# Patient Record
Sex: Female | Born: 1935 | Race: White | Hispanic: No | Marital: Married | Smoking: Never smoker
Health system: Southern US, Community
[De-identification: ages and names within clinical notes are randomized; demographics above are authoritative.]

## PROBLEM LIST (undated history)

## (undated) DIAGNOSIS — I2510 Atherosclerotic heart disease of native coronary artery without angina pectoris: Secondary | ICD-10-CM

## (undated) DIAGNOSIS — I482 Chronic atrial fibrillation, unspecified: Secondary | ICD-10-CM

## (undated) DIAGNOSIS — I10 Essential (primary) hypertension: Secondary | ICD-10-CM

## (undated) DIAGNOSIS — J189 Pneumonia, unspecified organism: Secondary | ICD-10-CM

## (undated) HISTORY — PX: EYE SURGERY: SHX253

## (undated) HISTORY — PX: CATARACT EXTRACTION: SUR2

## (undated) HISTORY — PX: COLON SURGERY: SHX602

## (undated) HISTORY — PX: DILATION AND CURETTAGE OF UTERUS: SHX78

## (undated) HISTORY — DX: Pneumonia, unspecified organism: J18.9

## (undated) HISTORY — PX: OTHER SURGICAL HISTORY: SHX169

## (undated) HISTORY — PX: ABDOMINAL HYSTERECTOMY: SHX81

## (undated) HISTORY — DX: Essential (primary) hypertension: I10

## (undated) HISTORY — DX: Chronic atrial fibrillation, unspecified: I48.20

## (undated) HISTORY — DX: Atherosclerotic heart disease of native coronary artery without angina pectoris: I25.10

---

## 1985-05-03 HISTORY — PX: OTHER SURGICAL HISTORY: SHX169

## 1998-02-27 ENCOUNTER — Encounter: Payer: Self-pay | Admitting: Ophthalmology

## 1998-03-04 ENCOUNTER — Ambulatory Visit (HOSPITAL_COMMUNITY): Admission: RE | Admit: 1998-03-04 | Discharge: 1998-03-04 | Payer: Self-pay | Admitting: Ophthalmology

## 2000-11-25 ENCOUNTER — Ambulatory Visit (HOSPITAL_COMMUNITY): Admission: RE | Admit: 2000-11-25 | Discharge: 2000-11-26 | Payer: Self-pay | Admitting: Cardiology

## 2001-01-30 ENCOUNTER — Ambulatory Visit (HOSPITAL_COMMUNITY): Admission: RE | Admit: 2001-01-30 | Discharge: 2001-01-30 | Payer: Self-pay | Admitting: Cardiology

## 2003-04-15 ENCOUNTER — Encounter: Admission: RE | Admit: 2003-04-15 | Discharge: 2003-04-15 | Payer: Self-pay | Admitting: Cardiology

## 2005-11-19 ENCOUNTER — Encounter: Admission: RE | Admit: 2005-11-19 | Discharge: 2005-11-19 | Payer: Self-pay | Admitting: Internal Medicine

## 2005-11-19 ENCOUNTER — Inpatient Hospital Stay (HOSPITAL_COMMUNITY): Admission: AD | Admit: 2005-11-19 | Discharge: 2005-12-15 | Payer: Self-pay | Admitting: Internal Medicine

## 2005-12-06 ENCOUNTER — Encounter: Payer: Self-pay | Admitting: Vascular Surgery

## 2006-06-01 ENCOUNTER — Encounter: Payer: Self-pay | Admitting: Cardiology

## 2007-12-29 ENCOUNTER — Encounter: Admission: RE | Admit: 2007-12-29 | Discharge: 2007-12-29 | Payer: Self-pay | Admitting: Internal Medicine

## 2010-02-02 ENCOUNTER — Ambulatory Visit: Payer: Self-pay | Admitting: Cardiology

## 2010-04-13 ENCOUNTER — Ambulatory Visit: Payer: Self-pay | Admitting: Cardiology

## 2010-08-13 ENCOUNTER — Ambulatory Visit (INDEPENDENT_AMBULATORY_CARE_PROVIDER_SITE_OTHER): Payer: Self-pay | Admitting: Cardiology

## 2010-08-13 ENCOUNTER — Telehealth: Payer: Self-pay | Admitting: Cardiology

## 2010-08-13 DIAGNOSIS — I4891 Unspecified atrial fibrillation: Secondary | ICD-10-CM

## 2010-08-13 NOTE — Telephone Encounter (Signed)
Patient wants to speak with Norma Fredrickson regarding [employer].being a part of [facility]. Please call her back.

## 2010-08-14 NOTE — Telephone Encounter (Signed)
Michelle Mccullough, Can you call her and see if she is to be seen any time soon. I have talked with her about Dr. Ronnald Nian retirement. She would like for you to call after 2 pm today.

## 2010-08-17 ENCOUNTER — Telehealth: Payer: Self-pay | Admitting: Cardiology

## 2010-08-17 NOTE — Telephone Encounter (Signed)
Missed your call on Fri. Regarding who her new Dr.will be after Tennant leaves.

## 2010-08-17 NOTE — Telephone Encounter (Signed)
Returning pts call; LM to call back

## 2010-08-18 NOTE — Telephone Encounter (Signed)
RN scheduled pt to f/u with Dr. [place]az[place] in August.  Pt aware.

## 2010-09-10 ENCOUNTER — Encounter: Payer: Self-pay | Admitting: Cardiology

## 2010-09-11 ENCOUNTER — Ambulatory Visit (INDEPENDENT_AMBULATORY_CARE_PROVIDER_SITE_OTHER): Payer: Medicare Other | Admitting: Cardiology

## 2010-09-11 DIAGNOSIS — I4891 Unspecified atrial fibrillation: Secondary | ICD-10-CM

## 2010-09-18 NOTE — H&P (Signed)
[place]. [facility]  Patient:    Michelle Mccullough, Michelle Mccullough                     MRN: [id number] Adm. Date:  11/25/00 Attending:  Colleen Can. Deborah Chalk, M.D. Dictator:   Jennet Maduro. Earl Gala, R.N., A.N.P. CCDaleen Bo R. Felipa Eth, M.D.   History and Physical  DATE OF BIRTH:  18-Jun-1918  CHIEF COMPLAINT:  Atypical chest pain.  HISTORY OF PRESENT ILLNESS:  Michelle Mccullough is a very pleasant 77 year old white female who appears much younger than her stated age.  She reports to our office on November 23, 2000 upon referral by her primary care Rael Tilly.  She was seen in the office by Dr. Lennon Alstrom. Avva on July 23rd and reported an approximate two-week history of atypical chest pain.  She notes that the discomfort is really more between the breasts, but down into the upper abdomen; perhaps there has been some localized to the left breast.  She describes the discomfort as feeling as "like heartburn."  She is really not prone to have any heartburn and has not had any since her pregnancies over 50 years ago.  She has had no associated shortness of breath.  There has been no light-headedness, no dizziness, no nausea, no vomiting.  She exercises on a routine basis and has had no discomfort noted.  She notes that on occasion she has taken some Tums which has helped intermittently.  She is unsure as to the exact duration of the episodes.  She was started on Nexium 40 mg a day on November 22, 2000.  She does have a feeling as if needing to burp.  When she was seen and evaluated by Dr. Felipa Eth, she was noted to have a slightly enlarged heart on chest x-ray when compared to a previous film.  Her 12-lead electrocardiogram showed new onset of atrial fibrillation when compared to past tracings from 1998.  She is now referred on for elective coronary angiography to rule out coronary disease prior to being initiated on Coumadin therapy.  PAST MEDICAL HISTORY: 1. Hypertension for at least 10 years. 2.  History of pneumonia in 1950. 3. Bilateral cataract surgeries, in 1999 and again in 2001. 4. Abdominal hysterectomy in 1960. 5. History of colon surgery due to a ruptured diverticulum in 1987. 6. Prior history of D&C in 1955. 7. Hemorrhoidectomy in 1962. 8. Childbirth x 2.  There are no reports of previous heart disease, stroke or diabetes or thyroid dysfunction.  ALLERGIES:  SULFA.  INTOLERANCES:  VALIUM causes her "to feel dopey."  CURRENT MEDICINES: 1. Cartia XT 180 mg a day. 2. Premarin daily. 3. HCTZ 25 mg on Mondays, Wednesdays and Fridays. 4. Baby aspirin daily. 5. Multivitamin daily. 6. Nexium 40 mg daily.  FAMILY HISTORY:  Her father lived to be the age of 81 and died of stroke and had a questionable history of hypertension.  Mother lived to the age of 36 and died following hip surgery.  She has two brothers and three sisters who are alive.  One brother has died due to leukemia.  SOCIAL HISTORY:  She is married.  She lives with her husband.  She moved to [facility] on January 12, 2000.  She remains very active.  She has no history of tobacco use and engages in occasional wine use, perhaps one to two times a month.  REVIEW OF SYSTEMS:  She has had no light-headedness,  no dizziness.  There has been no frank syncope.  Her appetite remains good.  Sleep habits are somewhat erratic but that has been of a chronic nature.  She has had no fever, flu or cough.  She has had no shortness of breath.  Her chest discomfort is as described above.  She has had no chest wall tenderness.  She denies a history of palpitations.  No tachycardia.  No heart pounding or forcefulness noted. She has had no abdominal pain, no constipation, no diarrhea, no peripheral edema.  PHYSICAL EXAMINATION:  GENERAL:  She is a very pleasant, quite conversive white female who appears younger than her stated age and she is currently pain-free at rest.  Weight is 125-1/2 pounds.  VITAL SIGNS:   Blood pressure is 130/60, sitting; 120/60, standing.  Heart rate is 88 and irregular.  SKIN:  Warm and dry.  Color is unremarkable.  NECK:  Supple.  No JVD.  LUNGS:  Clear.  HEART:  Irregularly irregular rhythm.  ABDOMEN:  Soft.  Positive bowel sounds.  EXTREMITIES:  Without edema.  Distal pulses are intact.  NEUROLOGIC:  Intact.  LABORATORY DATA:  Labs are currently pending.  CLINICAL DATA:  Twelve-lead electrocardiogram performed on July 23rd shows atrial fibrillation with inverted T waves in leads V2.  OVERALL IMPRESSION: 1. Atypical chest pain. 2. New onset of atrial fibrillation of questionable duration. 3. Reported cardiomegaly. 4. Hypertension.  PLAN:  We will obtain 2-D echocardiogram here in the office today on July 24th.  Prescription for sublingual nitroglycerin is made available to her with full instructions given.  We will need to proceed on with cardiac catheterization; that procedure is discussed in full detail including the risks of possibility of bleeding, bruising, blood clot to the leg, as well as possibility of allergy to the x-ray dye, irregular rhythms, stroke, heart attack and even the possibility of death was discussed with both her and her husband and they are willing to proceed.  The procedure has been tentatively arranged for Friday, November 25, 2000. DD:  11/23/00 TD:  11/23/00 Job: [id number]

## 2010-09-18 NOTE — H&P (Signed)
Michelle Mccullough, Michelle Mccullough            ACCOUNT NO.:  [id number]   MEDICAL RECORD NO.:  [id number]          PATIENT TYPE:  INP   LOCATION:  3710                         FACILITY:  [facility]   PHYSICIAN:  Larina Earthly, M.D.        DATE OF BIRTH:  July 17, 1918   DATE OF ADMISSION:  11/19/2005  DATE OF DISCHARGE:                                HISTORY & PHYSICAL   CHIEF COMPLAINT:  Abdominal pain and nausea.   HISTORY OF PRESENT ILLNESS:  This is an 77 year old Caucasian female with  multiple remote abdominal surgeries in the setting of atrial fibrillation,  on anticoagulation, managed by Dr. Deborah Chalk, who presented to my office on  November 18, 2005 with an 18-hour history of increasing abdominal pain that  started approximately 1-1/2 hours after her last bowel movement.  Her  symptoms were associated with nausea but no vomiting.  No fevers or chills.  No shortness of breath or chest pain.  She had no blood in her stools or in  her urine.  Patient was seen towards the end of the day on November 18, 2005,  given the fact that her physical exam was unremarkable with the exception of  some mild-to-moderate centrally located abdominal discomfort without  rebound.  The plan was to obtain labs in our office as well as an outpatient  abdominal pelvic CT scan the next morning with IV and p.o. contrast.  She  was also started empirically on Augmentin 875 mg p.o. b.i.d. with food.  She  and her husband felt satisfied with this approach versus ER evaluation.   She presents back to my office at lunch time on November 19, 2005 with a copy of  her CT scan report in hand revealing a partial small bowel obstruction,  likely due to adhesion in the distal small bowel in the left lower quadrant  associated with a small amount of ascites but no free air or other  complications.  She states that she has not been able to eat anything since  last seen other than the p.o. contrast.  She states that she has not had any  episodes of  vomiting but continues to have the abdominal discomfort.  She  denies any fevers or chills and is able to walk unimpeded around our office.   Given the findings on her CT scan and review of the labs obtained the  previous night, which includes a white blood cell count of 12.8, she is now  being admitted to a telemetry bed over at [facility] for possible  surgical evaluation and consult by Dr. Wenda Low as well as empiric  antibiotics and continued observation.   REVIEW OF SYSTEMS:  As above, but specifically negative for fevers, chills,  vomiting, blood in stool or urine.  Negative for rigors, negative for chest  pain, shortness of breath, or palpitations.  Negative for new neurologic or  musculoskeletal deficits.   PROBLEM LIST:  1. Hypertension.  2. Atrial fibrillation with a history of DC cardioversion but continues to      be in atrial fibrillation.  3. Anticoagulation.  4.  Total abdominal hysterectomy in 1967 with the exception of one ovary.  5. Lysis of adhesions as well as unilateral salpingo-oophorectomy in 1987.  6. Degenerative joint disease.  7. Hemorrhoidectomy in the 1970s.  8. Hyperlipidemia.  9. Osteopenia.   SOCIAL HISTORY:  The patient is married for 65 years, is a retired  Airline pilot and has no history of alcohol or tobacco use.   FAMILY HISTORY:  Significant for a father having died at the age of 65 of  congestive heart failure and stroke.  Mother having died at the age of 67 of  degenerative joint disease and heart failure.  Multiple siblings have  arthritis.   ALLERGIES:  SULFA, VALIUM.   MEDICATIONS:  1. Patient is currently on Diltiazem extended release, 240 mg each day.  2. Coumadin 5 mg 4 times a week and 2.5 mg 3 times a week, managed by Dr.      Deborah Chalk.  3. Multivitamin.  4. Tylenol p.r.n.  5. Calcium.  6. Vitamin D supplementation.  7. Patient has a history of using Zocor 40 mg; however, this has been      discontinued by the  patient.   LABORATORY EVALUATION:  CT scan as above.   Sodium 140, potassium 4.2, BUN 19, creatinine 1.3, glucose 182, calcium  10.3, AST 24, ALT 24, total bilirubin 1.3, alkaline phosphatase 81.  White  blood cell count 12.8, hemoglobin 16.2, hematocrit 48%, platelet count 268.  Urinalysis positive for slight infection.  Please note that these labs were  obtained in our office from November 18, 2005.  Further labs are pending at the  hospital.   PHYSICAL EXAMINATION:  VITAL SIGNS:  Weight is 115 pounds.  Blood pressure  110/74, pulse 80, respirations 18 with blood pressure increasing to 130/96  with some agitation.  Temperature 98.3 degrees Fahrenheit.  GENERAL:  We have an anxious but stable Caucasian female sitting on an exam  table in no apparent distress, answering all questions appropriately.  Alert  and oriented x3.  HEENT:  Sclerae are anicteric.  Extraocular movements are intact.  There are  no oropharyngeal lesions.  NECK:  Supple.  There is no cervical lymphadenopathy.  LUNGS:  Clear to auscultation bilaterally.  CARDIOVASCULAR:  An irregularly irregular rhythm.  ABDOMEN:  Soft, nondistended abdomen.  Bowel sounds are present.  Patient is  tender in the left greater than right lower quadrant to a moderate degree  with no rebound.  EXTREMITIES:  There is no lower extremity edema.  Pedal pulses are intact.  NEUROLOGIC:  Grossly nonfocal.   ASSESSMENT/PLAN:  1. Partial small bowel obstruction:  Will give clear fluids and observe.      Will obtain surgical consultation with Dr. Wenda Low and provide      empiric IV antibiotics consisting of Unasyn after blood cultures are      obtained, especially in light of elevated white blood cell count.  Of      note, there is no evidence of an infection that was seen on the CT scan      by preliminary report.  2. Atrial fibrillation:  Now currently hemodynamically stable on calcium     channel blocker and anticoagulation.  May need to  change calcium      channel blockade to the intravenous form, especially if the patient is      made n.p.o. or becomes unstable.  3. Anticoagulation:  Will hold anticoagulation for now and will defer      reversal of  the Coumadin levels to Dr.      Daphine Deutscher, pending potential surgery.  4. Hypertension:  Controlled.  5. Per my previous and current discussions with the patient, the patient      wishes a DNR code status.      Larina Earthly, M.D.  Electronically Signed     RA/MEDQ  D:  11/19/2005  T:  11/19/2005  Job:  [id number]   cc:   Colleen Can. Deborah Chalk, M.D.  [place] Daphine Deutscher, MD

## 2010-09-18 NOTE — Cardiovascular Report (Signed)
[place]. [facility]  Patient:    Michelle Mccullough, Michelle Mccullough                   MRN: [id number] Proc. Date: 11/25/00 Adm. Date:  [id number] Attending:  Eleanora Neighbor CC:         Lennon Alstrom. Felipa Eth, M.D.   Cardiac Catheterization  REFERRING PHYSICIAN:  Ravi R. Felipa Eth, M.D.  INDICATIONS:  Ms. Schemm is referred for evaluation of chest pain in the setting of atrial fibrillation.  She has new onset of both symptoms.  PROCEDURE:  Left heart catheterization with selective coronary angiography, left ventricular angiography, abdominal aortogram, and right femoral arteriogram.  TYPE AND SITE OF ENTRY:  Percutaneous right femoral artery.  CATHETERS:  The 6-French four-curved Judkins right and left coronary catheters, 6-French pigtail ventriculographic catheter.  CONTRAST:  Omnipaque.  MEDICATIONS GIVEN PRIOR TO PROCEDURE:  Valium 10 mg p.o.  MEDICATIONS GIVEN DURING PROCEDURE:  None.  COMMENTS:  The patient tolerated the procedure well.  HEMODYNAMIC DATA:  The aortic pressure was 167/80.  LV was 167/17.  There was no aortic valve gradient noted on pullback.  ANGIOGRAPHIC DATA:  Left ventricular angiogram:  A left ventriculogram angiogram was performed in the RAO position.  Overall cardiac size and silhouette were normal.  Left ventricular function was normal.  Global ejection fraction was 70%.  Coronary arteries: 1. Left main:  The left main coronary artery is normal. 2. Left anterior descending artery:  The left anterior descending is a    relatively small vessel and ends on the anterior wall.  There is a 30%    ostial narrowing present.  The left anterior descending artery itself is    very tortuous and without any significant focal disease. 3. Left circumflex:  The left circumflex has three relatively small, very    tortuous marginal vessels.  There is no significant atherosclerotic    disease present. 4. Right coronary artery:  The right coronary artery  is a large tortuous    vessel and the posterior descending and posterolateral branches are very    tortuous.  It is essentially normal.  Abdominal aortogram and iliac arteriogram shows markedly tortuous and fibromuscular dysplasia in the right and left iliac vessels.  The abdominal aortogram demonstrates some degree of fibromuscular dysplasia that probably is in the right renal artery as well.  The right iliac vessel has more of this dysplastic appearance than the left iliac.  OVERALL IMPRESSION: 1. Normal left ventricular function (atrial fibrillation). 2. Minimal coronary atherosclerosis (30% proximal left anterior descending    artery, minimal atherosclerosis otherwise). 3. Probable fibromuscular dysplasia of the right renal artery and    predominantly, right iliac, but also left iliac vessel as well.  DISCUSSION:  Will proceed on with our plans for management of her atrial fibrillation.  She will at some point become a candidate for vascular evaluation of probable need for vascular consult. DD:  11/25/00 TD:  11/25/00 Job: [id number]

## 2010-09-18 NOTE — Op Note (Signed)
[place]. [facility]  Patient:    Michelle Mccullough, Michelle Mccullough Visit Number: [id number] MRN: [id number]          Service Type: CAT Location: [facility] 2899 16 Attending Physician:  Eleanora Neighbor Dictated by:   Colleen Can Deborah Chalk, M.D. Proc. Date: 01/30/01 Admit Date:  01/30/2001   CC:         Ravi R. Felipa Eth, M.D.  Judie Petit, M.D.   Operative Report  ELECTRICAL CARDIOVERSION  HISTORY OF PRESENT ILLNESS:  Ms. Wooton had atrial fibrillation, minimal coronary disease, history of hypertension.  She had been on Coumadin anticoagulation.  Left atrial size was 3.7 cm.  She received penathol anesthesia administered by Judie Petit, M.D.  Using anterior posterior paddles, she is converted to sinus rhythm with a single 200 W second shock.  The patient tolerated the procedure well. Dictated by:   Colleen Can Deborah Chalk, M.D. Attending Physician:  Eleanora Neighbor DD:  01/30/01 TD:  01/30/01 Job: [id number]

## 2010-09-18 NOTE — Op Note (Signed)
NAMEVAIL, BASISTA NO.:  [id number]   MEDICAL RECORD NO.:  [id number]          PATIENT TYPE:  INP   LOCATION:  2902                         FACILITY:  [facility]   PHYSICIAN:  Sandria Bales. Ezzard Standing, M.D.  DATE OF BIRTH:  04/06/1919   DATE OF PROCEDURE:  DATE OF DISCHARGE:                                 OPERATIVE REPORT   PREOPERATIVE DIAGNOSIS:  Small bowel obstruction.   POSTOPERATIVE DIAGNOSIS:  Small bowel obstruction secondary to adhesions in  pelvis with patient having extensive pelvic adhesions.   PROCEDURE:  Enterolysis of adhesions.  (Spent two hours doing the  enterolysis).   SURGEON:  Sandria Bales. Ezzard Standing, M.D.   ASSISTANT:  Sharlet Salina T. Hoxworth, M.D.   ANESTHESIA:  General endotracheal.   ESTIMATED BLOOD LOSS:  300 mL.   DRAINS:  None.   INDICATIONS FOR PROCEDURE:  The patient is an 77 year old white female who  is a patient of Dr. Larina Earthly admitted with a partial bowel obstruction  which has not gotten better.  A CT scan revealed a near complete bowel  obstruction.  Patient now comes for attempted open laparotomy for  enterolysis of adhesions.   The indications and potential complications explained to the patient.  Potential complications include but not limited to bleeding, infection,  bowel injury and the need of resection of her bowel.   OPERATE NOTE:  Patient was placed in the supine position given general  anesthetic.  She had a Foley catheter, TED stockings in place.  She had a  small NG tube placed in the operation.  She was already on Unasyn as an  antibiotic.   The abdomen was prepped with Betadine solution and sterilely draped.  A  midline incision was made with sharp dissection and carried down into the  abdominal cavity.  Patient was noted to have omentum attached to her  anterior abdominal wall.   Sharp dissection carried out down and noting dilated small bowel on one  side, decompressed small bowel on the other side and what she  had was just  gnarled small bowel down in her pelvis.  Her sigmoid colon was along the  left pelvic wall and we were looking at a pre-pelvic space and/or vaginal  cuff.  She had at least three or four loops with fairly densely adhesed  loops of small bowel.  The loops went way down into the pelvis and were  really hard to expose.   Essentially two hours digging these loops of small bowel up until they are  finally free but I was able to finally free up the small bowel.  I had two  serosal tears that I oversewed with 3-0 silk suture.  I thought I  devascularized none of the bowel nor had a compromise or injury to the  bowel.  I did find what I thought was the point of obstruction and that is  actually at the very front end of all these adhesions of the small bowel  where she had a transition point of dilated chronic small bowel proximal to  this decompressed soft distal bowel to this area.  After complete excision of the lyses of adhesions I irrigated out the  pelvis.  She had a couple of bleeding veins from the pelvis.  So I cleared  up with cautery, a couple that I put some clips on and I put some Surgicel  down in the pelvis.  Unfortunately during the procedure after spending two  hours to get this small bowel out of the pelvis I then led the bowel back  down which basically filled back up the pelvis as it was before.   I tried to bring some omentum down to the pelvis but the abdomen only  reached about 1/2 the way to the pelvis.   The patient had a prior gastrostomy tube it appeared but I did have them  replace the NG tube with large NG tube.   The abdomen was then irrigated with 3 L of saline.   I then closed the abdomen with two running #1 PDS sutures with some  interrupted PDS sutures.  I then closed the skin with a skin gun.   The patient tolerated the procedure well.  The estimated blood loss was 200  to 300 mL.  Drains left in were none.  Again, I spent extensive time  doing  enterolysis of adhesions and her bowel looked good at the end of the  procedure.      Sandria Bales. Ezzard Standing, M.D.  Electronically Signed     DHN/MEDQ  D:  11/24/2005  T:  11/25/2005  Job:  [id number]   cc:   Larina Earthly, M.D.  Fax: [phone]   Gwen Pounds, MD  Fax: [phone]

## 2010-09-18 NOTE — H&P (Signed)
[place]  Patient:    Michelle Mccullough, Michelle Mccullough Visit Number: [id number] MRN: [id number]          Service Type: CAT Location: [facility] 2899 [id number] Attending Physician:  Eleanora Neighbor Dictated by:   Jennet Maduro Earl Gala, R.N., A.N.P. Admit Date:  11/25/2000 Discharge Date: 11/26/2000   CC:         Ravi R. Felipa Eth, M.D.   History and Physical  CHIEF COMPLAINT:  None.  HISTORY OF PRESENT ILLNESS:  Mrs. Mcfaul is a very pleasant, 77 year old, white female who has had newly diagnosed atrial fibrillation.  She has undergone coronary angiography which basically showed minimal coronary artery disease.  That study also demonstrated fibromuscular dysplasia of the right renal, as well as bilateral iliac vessels.  In light of her atrial fibrillation, she was maintained on Coumadin.  Following the cardiac catheterization, her INRs have now been therapeutic and she presents for elective cardioversion.  She has had very intermittent dizziness.  There has been no frank syncope. She has had no chest pain and no shortness of breath.  Her energy level remains satisfactory.  PAST MEDICAL HISTORY: 1. Atrial fibrillation of questionable onset. 2. Coumadin therapy. 3. Hypertension. 4. History of pneumonia in 1950. 5. Coronary angiography in July of 2002.  ALLERGIES:  SULFA.  INTOLERANCES:  VALIUM.  CURRENT MEDICATIONS: 1. Coumadin 5 mg x 3 and 2.5 mg x 4. 2. Cartia XT 180 mg a day. 3. Premarin daily. 4. Hydrochlorothiazide on Monday, Wednesday, and Friday.  FAMILY HISTORY:  Her father lived to be 25 years old, died of a stroke, and had a history of hypertension.  Her mother lived to be the age of 39 and died following hip surgery.  SOCIAL HISTORY:  She is married.  She lives with her husband at [facility].  She remains very active.  She has had no history of tobacco use.  She does engage in occasional wine, but none since she has been  anticoagulated with Coumadin.  REVIEW OF SYSTEMS:  There has been no syncope.  Very intermittent lightheadedness, which is primarily positional in nature.  She has had no chest pain and no shortness of breath.  Appetite remains good.  Sleep habits are chronic erratic.  She has had no fever, flu, or cough.  She has had no recurrence of chest discomfort.  No palpitations.  No tachycardia.  She has had no awareness of her atrial fibrillation.  No abdominal pain.  No constipation.  No diarrhea.  Otherwise the review of systems is unremarkable, except for as stated.  PHYSICAL EXAMINATION:  She is very pleasant and appears much younger than her stated.  She is quite conversive and pleasant.  VITAL SIGNS:  The blood pressure is 110/60 sitting and 100/60 standing.  The heart rate is 72 and irregular.  Respirations 18.  She is afebrile.  SKIN:  Warm and dry.  The color is unremarkable.  NECK:  Supple.  No JVD.  No carotid bruits.  No thyromegaly.  No lymphadenopathy.  LUNGS:  Clear.  BACK:  Examination is unremarkable.  CARDIAC:  An irregularly irregular rhythm.  There is no murmur.  She has no chest wall tenderness.  ABDOMEN:  Soft.  Positive bowel sounds.  Nontender.  No masses.  EXTREMITIES:  Without edema.  NEUROLOGIC:  Intact.  There are no gross focal deficits.  LABORATORY DATA:  Pending.  OVERALL IMPRESSION: 1. Atrial fibrillation. 2. Chronic Coumadin therapy. 3. Previous cardiac catheterization revealing minimal coronary  disease. 4. Probable fibromuscular dysplasia of the right renal and bilateral iliac    vessels.  PLAN:  Will proceed on with elective cardioversion.  The procedure has been discussed in full detail and she is willing to proceed. Dictated by:   Jennet Maduro Earl Gala, R.N., A.N.P. Attending Physician:  Eleanora Neighbor DD:  01/23/01 TD:  01/23/01 Job: [id number]

## 2010-09-18 NOTE — Discharge Summary (Signed)
Michelle, Mccullough            ACCOUNT NO.:  [id number]   MEDICAL RECORD NO.:  [id number]          PATIENT TYPE:  INP   LOCATION:  3706                         FACILITY:  [facility]   PHYSICIAN:  Larina Earthly, M.D.        DATE OF BIRTH:  04-07-1919   DATE OF ADMISSION:  11/19/2005  DATE OF DISCHARGE:  12/15/2005                                 DISCHARGE SUMMARY   DISCHARGE DIAGNOSES:  1. Small bowel obstruction, with a history of multiple surgeries, status      post lysis of adhesions on November 24, 2005 by Dr. Ovidio Kin,      complicated by prolonged hospital course, with slow bowel motility and      continued partial small bowel obstruction, now improved, based on      radiological evidence and KUB on December 14, 2005, dependent on total      nutrient admixture for nutritional support.  2. Atrial fibrillation, chronic, on anticoagulation, with increasing      calcium channel blockade during hospitalization for increased heart      rate.  3. Anticoagulation, with no bleeding complications, and last PT/INR 2.4 on      December 14, 2005.  4. Type 2 diabetes mellitus, new diagnosis, treated with insulin with      total nutrient admixture during hospitalization, with normalization of      CBGs to near normal levels after discontinuation of total nutrient      admixture and further followup planned on an outpatient basis.  5. Urinary retention, status post transient Foley catheter use, with      infection workup unremarkable on December 10, 2005.  6. Transient right eye visual abnormalities for approximately 30 minutes      by patient report, and resolved on December 08, 2005 without any further      intervention for effects. Carotid ultrasound on December 08, 2005      revealing no significant internal carotid artery plaques or stenosis.  7. Physical deconditioning, now ambulating independently in the hallway      multiple times a day.  8. Constipation, treated with MiraLax each day, with good  results, in the      setting of a history of irritable bowel syndrome.   SECONDARY DIAGNOSES:  1. History of total abdominal hysterectomy in 1967, with the exception of      one ovary.  2. Hypertension.  3. Lysis of adhesions as well as unilateral salpingo-oophorectomy in 1987.  4. Degenerative joint disease.  5. Hemorrhoidectomy in the 1970s.  6. Hyperlipidemia.  7. Osteopenia.   DISCHARGE MEDICATIONS:  1. Diltiazem extended release 240 mg each day.  2. Coumadin 5 mg 4 times a week, and 2.5 mg 3 times a week, managed by Dr.      Deborah Chalk.  3. Multivitamin.  4. Tylenol p.r.n.  5. Calcium, currently on hold, given issues with bowel motility, to be      reinitiated at my discretion on an outpatient basis.  6. Vitamin D supplementation.  7. MiraLax 17 g p.o. daily, over-the-counter use satisfactory.   DISCHARGE LABORATORIES:  Sodium  137, potassium 4.2, BUN 14, creatinine 0.7,  glucose 78, prealbumin 13.6, AST 50, ALT 56, alkaline phosphatase 53, total  bilirubin 0.5.  PT/INR 2.4, albumin 2.4, calcium 8.3.  White blood cell  count 13.2, increased from 8.7 approximately 3 days ago, hemoglobin 11.3,  hematocrit 33.1%, platelet count 259.  KUB from December 14, 2005 reveals  dilated loops of small bowel but improved compared to previous comparisons.  Urinalysis with urine culture unremarkable on December 10, 2005.  CT of the  abdomen and pelvis on December 09, 2005 revealed stable bilateral pleural  effusions, bibasilar atelectasis, 1 cm cystic lesion in the pancreatic head  bilateral hydronephrosis probably due to distended urinary bladder, and  partial small-bowel obstruction which is slightly improved compared to prior  CT, with no mass lesion causing any obstruction.  CT of the abdomen and  pelvis from November 23, 2005 revealed persistent small bowel obstruction  distally, mild bilateral calyectasis, incidental, cardiomegaly, new small  right pleural effusion, stable CT appearance of the  pancreas and left  kidney, and zone of transition for small bowel obstruction in the midpelvis  likely due to adhesion.  Other pertinent labs on admission:  White blood  cell count 15.8, hemoglobin 17.5, platelet count 182.  On admission, PT/INR  2.5.  On admission, sodium 133, potassium 3.5, BUN 30, creatinine 1.6,  albumin 4.7.  On admission, liver transaminases normal.  BNP on November 25, 2005 was 226.  Prealbumin on November 26, 2005 was 12.3.   HISTORY OF THE PRESENT ILLNESS:  This is an 77 year old Caucasian female  with multiple remote abdominal surgeries in the setting of atrial  fibrillation anticoagulation, who presented on November 18, 2005 with an 18-hour  history of increasing abdominal pain, with no nausea or vomiting or chills  or fevers.  She denied any cardiovascular review of systems, and she denied  any blood in her urine or stool.  She was started empirically on oral  antibiotics consisting of Augmentin, with pelvic and abdominal CT ordered.  They were significant for small bowel obstruction in the setting of possible  adhesions in the midpelvis, as mentioned above, and she was subsequently  admitted on November 19, 2005 for further evaluation and management, with  surgical consultation.  She was started on empiric antibiotics consisting of  Unasyn, but no evidence of infection was seen on CT scan.   HOSPITAL COURSE:  The patient was followed conservatively by internal  medicine and surgery for several days, with no relief of the patient's  symptoms.  She was taken to the operating room on November 24, 2005 by Dr.  Ezzard Standing, where she underwent enterolysis of adhesions, which took  approximately 2 hours for the enterolysis secondary to multiple adhesions.  For the next approximately 2-1/2 weeks, she was maintained on TNA for  nutrition and had a very slow but steady recovery of her bowel motility. She  continued to be anorexic, with minimal bowel movements of the formed kind made during  her 2-week course.  She showed signs of improvement the last  week of her hospitalization, where she was having formed bowel movements,  but continued anorexia with minimal p.o. intake in the setting of TNA.  TNA  was weaned, and her caloric intake improved.  TNA was finally discontinued  on December 14, 2005, with increase in p.o. intake by the morning of December 15, 2005, and she was deemed appropriate for discharge on said day with  close followup.   Her  other hospital course was complicated by, as mentioned above in  discharge diagnoses, atrial fibrillation which was fairly well controlled  with increasing need for calcium channel blockade during her  hospitalization, the development type 2 diabetes mellitus, urinary retention  which was transient, and transient right eye visual abnormalities with a  benign ultrasound workup and no recurrence of the same.  She continued to  ambulate on a regular basis and was deemed appropriate again for discharge  on December 15, 2005.   INSTRUCTIONS FOR DISCHARGE:  Medications as above.  She will also receive  home health evaluation for physical and occupational therapy as indicated.  She will also need to return to my office in approximately 10-14 days for  recheck CMET, CBC, and Dr. Deborah Chalk is to continue to follow the patient's  anticoagulation with Coumadin.      Larina Earthly, M.D.  Electronically Signed     RA/MEDQ  D:  12/15/2005  T:  12/15/2005  Job:  [id number]   cc:   Sandria Bales. Ezzard Standing, M.D.  Colleen Can. Deborah Chalk, M.D.

## 2010-10-01 LAB — PROTIME-INR: INR: 2.5 — AB (ref <=1.1)

## 2010-10-06 ENCOUNTER — Ambulatory Visit (INDEPENDENT_AMBULATORY_CARE_PROVIDER_SITE_OTHER): Payer: Self-pay | Admitting: *Deleted

## 2010-10-06 DIAGNOSIS — R0989 Other specified symptoms and signs involving the circulatory and respiratory systems: Secondary | ICD-10-CM

## 2010-10-15 ENCOUNTER — Encounter: Payer: Self-pay | Admitting: Cardiology

## 2010-11-10 ENCOUNTER — Encounter: Payer: Self-pay | Admitting: Cardiology

## 2010-11-10 ENCOUNTER — Ambulatory Visit: Payer: Self-pay | Admitting: *Deleted

## 2010-11-20 LAB — POCT INR: INR: 2.7

## 2010-11-23 ENCOUNTER — Ambulatory Visit (INDEPENDENT_AMBULATORY_CARE_PROVIDER_SITE_OTHER): Payer: Self-pay | Admitting: *Deleted

## 2010-11-23 DIAGNOSIS — I4891 Unspecified atrial fibrillation: Secondary | ICD-10-CM

## 2010-11-25 ENCOUNTER — Encounter: Payer: Self-pay | Admitting: Cardiology

## 2010-12-21 ENCOUNTER — Ambulatory Visit (INDEPENDENT_AMBULATORY_CARE_PROVIDER_SITE_OTHER): Payer: Self-pay | Admitting: Cardiovascular Disease

## 2010-12-21 DIAGNOSIS — R0989 Other specified symptoms and signs involving the circulatory and respiratory systems: Secondary | ICD-10-CM

## 2011-01-06 ENCOUNTER — Encounter: Payer: Self-pay | Admitting: Cardiology

## 2011-01-06 ENCOUNTER — Ambulatory Visit (INDEPENDENT_AMBULATORY_CARE_PROVIDER_SITE_OTHER): Payer: Medicare Other | Admitting: Cardiology

## 2011-01-06 DIAGNOSIS — I4891 Unspecified atrial fibrillation: Secondary | ICD-10-CM

## 2011-01-06 DIAGNOSIS — I10 Essential (primary) hypertension: Secondary | ICD-10-CM | POA: Insufficient documentation

## 2011-01-06 DIAGNOSIS — I482 Chronic atrial fibrillation, unspecified: Secondary | ICD-10-CM

## 2011-01-06 NOTE — Assessment & Plan Note (Signed)
Her rate is well-controlled. She is asymptomatic. She is on chronic Coumadin therapy and has had therapeutic INRs. I will follow up again in 6 months.

## 2011-01-06 NOTE — Patient Instructions (Signed)
Continue your current medications  I will see you again in 6 months.   

## 2011-01-06 NOTE — Progress Notes (Signed)
   Sherie Don Roudebush Date of Birth: 04-Jul-1918   History of Present Illness:  Mrs. Galluzzo is seen today for followup of her atrial fibrillation. She is now 77 years old. She has a history of permanent atrial fibrillation and has been treated with rate control and anticoagulation with Coumadin. She otherwise has been in excellent health. It appears that she has been in atrial fibrillation since 2002. She lives with her husband and at [facility] independently. She walks with a cane. She stays active. She denies any dizziness, palpitations, chest pain, or shortness of breath.  Current Outpatient Prescriptions on File Prior to Visit  Medication Sig Dispense Refill  . Brimonidine Tartrate (ALPHAGAN P OP) Apply to eye.        . Calcium-Magnesium-Vitamin D (CALCIUM MAGNESIUM PO) Take by mouth.        . Cholecalciferol (VITAMIN D PO) Take by mouth.        . diltiazem (DILACOR XR) 180 MG 24 hr capsule Take 180 mg by mouth daily.        Tery Sanfilippo Calcium (STOOL SOFTENER PO) Take by mouth.        . dorzolamide-timolol (COSOPT) 22.3-6.8 MG/ML ophthalmic solution 1 drop 2 (two) times daily.        Marland Kitchen GLUCOSAMINE PO Take by mouth.        . latanoprost (XALATAN) 0.005 % ophthalmic solution 1 drop at bedtime.        . Multiple Vitamins-Minerals (MACULAR VITAMIN BENEFIT PO) Take by mouth.        . Polyethylene Glycol 3350 (MIRALAX PO) Take by mouth as needed.        . Probiotic Product (PROBIOTIC PO) Take by mouth.        Jeananne Rama Sulfate (EYE DROPS A/C OP) Apply to eye.        . warfarin (COUMADIN) 5 MG tablet Take 5 mg by mouth daily.          No Known Allergies  Past Medical History  Diagnosis Date  . Chronic atrial fibrillation   . HTN (hypertension)   . Pneumonia   . CAD (coronary artery disease)     nonobstructive  . Glaucoma     Past Surgical History  Procedure Date  . Cataract extraction   . Colon surgery     x2 for diverticular disease and adhesions  .  Abdominal hysterectomy   . Hemorrhoidectomy   . Dilation and curettage of uterus     History  Smoking status  . Never Smoker   Smokeless tobacco  . Not on file    History  Alcohol Use No    No family history on file.  Review of Systems: As per history of present illness.  All other systems were reviewed and are negative. She has no prior history of stroke or bleeding faculties.  Physical Exam: BP 110/72  Pulse 76  Ht 5\' 1"  (1.549 m)  Wt 105 lb (47.628 kg)  BMI 19.84 kg/m2 She is an elderly white female in no acute distress. Her HEENT exam is unremarkable. She does have diminished vision related to glaucoma. Neck is supple no JVD, adenopathy, thyromegaly, or bruits. Lungs are clear. Cardiac exam reveals an irregular rate and rhythm with a grade 1/6 systolic outflow murmur. Abdomen is soft and nontender. She has no edema. Pulses are 2+ and symmetric. She is alert and fully oriented. Cranial nerves II through XII are intact. LABORATORY DATA:   Assessment / Plan:

## 2011-01-06 NOTE — Assessment & Plan Note (Signed)
Blood pressure is under excellent control. 

## 2011-01-18 ENCOUNTER — Ambulatory Visit (INDEPENDENT_AMBULATORY_CARE_PROVIDER_SITE_OTHER): Payer: Self-pay | Admitting: Internal Medicine

## 2011-01-18 DIAGNOSIS — R0989 Other specified symptoms and signs involving the circulatory and respiratory systems: Secondary | ICD-10-CM

## 2011-02-15 ENCOUNTER — Ambulatory Visit (INDEPENDENT_AMBULATORY_CARE_PROVIDER_SITE_OTHER): Payer: Self-pay | Admitting: Cardiology

## 2011-02-15 DIAGNOSIS — R0989 Other specified symptoms and signs involving the circulatory and respiratory systems: Secondary | ICD-10-CM

## 2011-02-24 ENCOUNTER — Other Ambulatory Visit: Payer: Self-pay | Admitting: Cardiology

## 2011-02-24 ENCOUNTER — Telehealth: Payer: Self-pay | Admitting: Cardiology

## 2011-02-24 MED ORDER — DILTIAZEM HCL ER COATED BEADS 120 MG PO CP24: 120.0000 mg | ORAL_CAPSULE | Freq: Every day | ORAL | Status: DC

## 2011-02-24 NOTE — Telephone Encounter (Signed)
escribe medication per fax request  

## 2011-02-24 NOTE — Telephone Encounter (Signed)
Opened in Error.

## 2011-02-24 NOTE — Telephone Encounter (Signed)
[facility] [phone] . Please called back to clarify direction./ dosage of med's. Pt stating she taken 120 mg

## 2011-03-16 ENCOUNTER — Ambulatory Visit (INDEPENDENT_AMBULATORY_CARE_PROVIDER_SITE_OTHER): Payer: Self-pay | Admitting: Cardiovascular Disease

## 2011-03-16 DIAGNOSIS — I482 Chronic atrial fibrillation, unspecified: Secondary | ICD-10-CM

## 2011-03-16 DIAGNOSIS — I4891 Unspecified atrial fibrillation: Secondary | ICD-10-CM

## 2011-03-16 DIAGNOSIS — R0989 Other specified symptoms and signs involving the circulatory and respiratory systems: Secondary | ICD-10-CM

## 2011-03-24 ENCOUNTER — Encounter: Payer: Self-pay | Admitting: Cardiology

## 2011-04-13 ENCOUNTER — Ambulatory Visit (INDEPENDENT_AMBULATORY_CARE_PROVIDER_SITE_OTHER): Payer: Self-pay | Admitting: Cardiology

## 2011-04-13 DIAGNOSIS — I4891 Unspecified atrial fibrillation: Secondary | ICD-10-CM

## 2011-04-13 DIAGNOSIS — I482 Chronic atrial fibrillation, unspecified: Secondary | ICD-10-CM

## 2011-04-13 DIAGNOSIS — R0989 Other specified symptoms and signs involving the circulatory and respiratory systems: Secondary | ICD-10-CM

## 2011-05-10 LAB — PROTIME-INR: INR: 4.6 — AB (ref 0.9–1.1)

## 2011-05-11 ENCOUNTER — Ambulatory Visit (INDEPENDENT_AMBULATORY_CARE_PROVIDER_SITE_OTHER): Payer: Self-pay | Admitting: Cardiology

## 2011-05-11 DIAGNOSIS — I482 Chronic atrial fibrillation, unspecified: Secondary | ICD-10-CM

## 2011-05-11 DIAGNOSIS — R0989 Other specified symptoms and signs involving the circulatory and respiratory systems: Secondary | ICD-10-CM

## 2011-05-11 DIAGNOSIS — I4891 Unspecified atrial fibrillation: Secondary | ICD-10-CM

## 2011-05-24 LAB — PROTIME-INR: INR: 3.1 — AB (ref 0.9–1.1)

## 2011-05-25 ENCOUNTER — Ambulatory Visit (INDEPENDENT_AMBULATORY_CARE_PROVIDER_SITE_OTHER): Payer: Self-pay | Admitting: Cardiology

## 2011-05-25 DIAGNOSIS — I482 Chronic atrial fibrillation, unspecified: Secondary | ICD-10-CM

## 2011-05-25 DIAGNOSIS — R0989 Other specified symptoms and signs involving the circulatory and respiratory systems: Secondary | ICD-10-CM

## 2011-06-09 ENCOUNTER — Ambulatory Visit (INDEPENDENT_AMBULATORY_CARE_PROVIDER_SITE_OTHER): Payer: Self-pay | Admitting: Internal Medicine

## 2011-06-09 DIAGNOSIS — I482 Chronic atrial fibrillation, unspecified: Secondary | ICD-10-CM

## 2011-06-09 DIAGNOSIS — R0989 Other specified symptoms and signs involving the circulatory and respiratory systems: Secondary | ICD-10-CM

## 2011-06-21 ENCOUNTER — Other Ambulatory Visit: Payer: Self-pay | Admitting: *Deleted

## 2011-06-21 MED ORDER — WARFARIN SODIUM 5 MG PO TABS: 5.0000 mg | ORAL_TABLET | ORAL | Status: DC

## 2011-06-23 ENCOUNTER — Ambulatory Visit: Payer: Self-pay | Admitting: Internal Medicine

## 2011-06-23 DIAGNOSIS — I482 Chronic atrial fibrillation, unspecified: Secondary | ICD-10-CM

## 2011-06-24 ENCOUNTER — Telehealth: Payer: Self-pay | Admitting: Cardiology

## 2011-06-24 NOTE — Telephone Encounter (Signed)
[facility] called spoke to patient's nurse was told needs INR checked 07/07/11. Instructions  faxed to [phone].

## 2011-06-24 NOTE — Telephone Encounter (Signed)
New msg Nurse wanted to know when to draw blood work for pt please call

## 2011-07-07 ENCOUNTER — Ambulatory Visit: Payer: Self-pay | Admitting: Cardiology

## 2011-07-07 DIAGNOSIS — I482 Chronic atrial fibrillation, unspecified: Secondary | ICD-10-CM

## 2011-07-20 LAB — PROTIME-INR: INR: 3 — AB (ref 0.9–1.1)

## 2011-07-22 ENCOUNTER — Telehealth: Payer: Self-pay | Admitting: Cardiology

## 2011-07-22 ENCOUNTER — Encounter: Payer: Self-pay | Admitting: *Deleted

## 2011-07-22 ENCOUNTER — Ambulatory Visit: Payer: Self-pay | Admitting: Cardiology

## 2011-07-22 DIAGNOSIS — I482 Chronic atrial fibrillation, unspecified: Secondary | ICD-10-CM

## 2011-07-22 NOTE — Telephone Encounter (Signed)
This encounter was created in error - please disregard.

## 2011-07-22 NOTE — Telephone Encounter (Signed)
New problem:  Per Hilda Lias, please fax an order for coumadin changes . Fax # [phone].

## 2011-07-23 NOTE — Telephone Encounter (Signed)
Spoke with Hilda Lias.  She received orders on pt yesterday.

## 2011-08-18 ENCOUNTER — Ambulatory Visit: Payer: Self-pay | Admitting: Internal Medicine

## 2011-08-18 DIAGNOSIS — I482 Chronic atrial fibrillation, unspecified: Secondary | ICD-10-CM

## 2011-09-02 LAB — PROTIME-INR: INR: 3.3 — AB (ref 0.9–1.1)

## 2011-09-03 ENCOUNTER — Ambulatory Visit: Payer: Self-pay | Admitting: Cardiology

## 2011-09-03 DIAGNOSIS — I482 Chronic atrial fibrillation, unspecified: Secondary | ICD-10-CM

## 2011-09-10 ENCOUNTER — Telehealth: Payer: Self-pay | Admitting: *Deleted

## 2011-09-10 NOTE — Telephone Encounter (Signed)
Patient refused INR draw today, request for it to be done next Tuesday 09/14/2011.

## 2011-09-15 ENCOUNTER — Ambulatory Visit: Payer: Self-pay | Admitting: Internal Medicine

## 2011-09-15 DIAGNOSIS — I482 Chronic atrial fibrillation, unspecified: Secondary | ICD-10-CM

## 2011-09-29 ENCOUNTER — Ambulatory Visit: Payer: Self-pay | Admitting: Internal Medicine

## 2011-09-29 DIAGNOSIS — I482 Chronic atrial fibrillation, unspecified: Secondary | ICD-10-CM

## 2011-10-07 ENCOUNTER — Encounter: Payer: Self-pay | Admitting: Cardiology

## 2011-10-18 ENCOUNTER — Other Ambulatory Visit: Payer: Self-pay | Admitting: Pharmacist

## 2011-10-18 MED ORDER — WARFARIN SODIUM 5 MG PO TABS: 5.0000 mg | ORAL_TABLET | ORAL | Status: DC

## 2011-10-19 ENCOUNTER — Encounter: Payer: Self-pay | Admitting: Cardiology

## 2011-10-20 ENCOUNTER — Ambulatory Visit: Payer: Self-pay | Admitting: Cardiology

## 2011-10-20 DIAGNOSIS — I482 Chronic atrial fibrillation, unspecified: Secondary | ICD-10-CM

## 2011-11-10 ENCOUNTER — Ambulatory Visit: Payer: Self-pay | Admitting: Internal Medicine

## 2011-11-10 DIAGNOSIS — I482 Chronic atrial fibrillation, unspecified: Secondary | ICD-10-CM

## 2011-11-11 ENCOUNTER — Telehealth: Payer: Self-pay | Admitting: Cardiology

## 2011-11-11 NOTE — Telephone Encounter (Signed)
New msg Michelle Mccullough calling back about coumadin

## 2011-11-11 NOTE — Telephone Encounter (Signed)
Spoke to [employer] she stated she already called coumadin clinic.

## 2011-11-30 ENCOUNTER — Ambulatory Visit: Payer: Self-pay | Admitting: Cardiology

## 2011-11-30 DIAGNOSIS — I482 Chronic atrial fibrillation, unspecified: Secondary | ICD-10-CM

## 2011-11-30 LAB — PROTIME-INR: INR: 3 — AB (ref 0.9–1.1)

## 2011-12-14 LAB — PROTIME-INR: INR: 3.1 — AB (ref 0.9–1.1)

## 2011-12-15 ENCOUNTER — Ambulatory Visit (INDEPENDENT_AMBULATORY_CARE_PROVIDER_SITE_OTHER): Payer: Medicare Other | Admitting: Internal Medicine

## 2011-12-15 DIAGNOSIS — I4891 Unspecified atrial fibrillation: Secondary | ICD-10-CM

## 2011-12-15 DIAGNOSIS — I482 Chronic atrial fibrillation, unspecified: Secondary | ICD-10-CM

## 2011-12-29 LAB — PROTIME-INR: INR: 3.7 — AB (ref 0.9–1.1)

## 2011-12-30 ENCOUNTER — Ambulatory Visit: Payer: Self-pay | Admitting: Cardiology

## 2011-12-30 DIAGNOSIS — I482 Chronic atrial fibrillation, unspecified: Secondary | ICD-10-CM

## 2012-01-12 LAB — PROTIME-INR: INR: 3.1 — AB (ref 0.9–1.1)

## 2012-01-13 ENCOUNTER — Ambulatory Visit: Payer: Self-pay | Admitting: Cardiology

## 2012-01-13 DIAGNOSIS — I482 Chronic atrial fibrillation, unspecified: Secondary | ICD-10-CM

## 2012-01-27 ENCOUNTER — Ambulatory Visit: Payer: Self-pay | Admitting: Cardiology

## 2012-01-27 DIAGNOSIS — I482 Chronic atrial fibrillation, unspecified: Secondary | ICD-10-CM

## 2012-02-09 LAB — PROTIME-INR: INR: 2.8 — AB (ref 0.9–1.1)

## 2012-02-10 ENCOUNTER — Ambulatory Visit: Payer: Self-pay | Admitting: Internal Medicine

## 2012-02-10 DIAGNOSIS — I482 Chronic atrial fibrillation, unspecified: Secondary | ICD-10-CM

## 2012-02-18 ENCOUNTER — Other Ambulatory Visit: Payer: Self-pay | Admitting: Cardiology

## 2012-02-18 ENCOUNTER — Other Ambulatory Visit: Payer: Self-pay

## 2012-02-18 MED ORDER — DILTIAZEM HCL ER COATED BEADS 120 MG PO CP24: 120.0000 mg | ORAL_CAPSULE | Freq: Every day | ORAL | Status: DC

## 2012-02-18 NOTE — Telephone Encounter (Signed)
Refill- pt is out of medication, plz refill this RX ASAP verified pharamacy is correct [facility]

## 2012-02-23 LAB — PROTIME-INR: INR: 1.9 — AB (ref 0.9–1.1)

## 2012-02-24 ENCOUNTER — Ambulatory Visit: Payer: Self-pay | Admitting: Cardiovascular Disease

## 2012-02-24 DIAGNOSIS — I482 Chronic atrial fibrillation, unspecified: Secondary | ICD-10-CM

## 2012-03-08 LAB — PROTIME-INR: INR: 2 — AB (ref 0.9–1.1)

## 2012-03-09 ENCOUNTER — Ambulatory Visit: Payer: Self-pay | Admitting: Cardiology

## 2012-03-09 DIAGNOSIS — I482 Chronic atrial fibrillation, unspecified: Secondary | ICD-10-CM

## 2012-03-16 ENCOUNTER — Other Ambulatory Visit: Payer: Self-pay

## 2012-03-16 MED ORDER — DILTIAZEM HCL ER COATED BEADS 120 MG PO CP24: 120.0000 mg | ORAL_CAPSULE | Freq: Every day | ORAL | Status: DC

## 2012-03-29 ENCOUNTER — Ambulatory Visit: Payer: Self-pay | Admitting: Internal Medicine

## 2012-03-29 DIAGNOSIS — I482 Chronic atrial fibrillation, unspecified: Secondary | ICD-10-CM

## 2012-04-05 ENCOUNTER — Ambulatory Visit: Payer: Medicare Other | Admitting: Cardiology

## 2012-04-07 ENCOUNTER — Ambulatory Visit: Payer: Self-pay | Admitting: Cardiology

## 2012-04-07 DIAGNOSIS — I482 Chronic atrial fibrillation, unspecified: Secondary | ICD-10-CM

## 2012-04-12 ENCOUNTER — Encounter: Payer: Self-pay | Admitting: Cardiology

## 2012-04-12 ENCOUNTER — Ambulatory Visit (INDEPENDENT_AMBULATORY_CARE_PROVIDER_SITE_OTHER): Payer: Medicare Other | Admitting: Cardiology

## 2012-04-12 VITALS — BP 154/86 | HR 70 | Ht 61.0 in | Wt 101.8 lb

## 2012-04-12 DIAGNOSIS — I4891 Unspecified atrial fibrillation: Secondary | ICD-10-CM

## 2012-04-12 DIAGNOSIS — Z7901 Long term (current) use of anticoagulants: Secondary | ICD-10-CM

## 2012-04-12 DIAGNOSIS — I6350 Cerebral infarction due to unspecified occlusion or stenosis of unspecified cerebral artery: Secondary | ICD-10-CM

## 2012-04-12 DIAGNOSIS — I639 Cerebral infarction, unspecified: Secondary | ICD-10-CM

## 2012-04-12 DIAGNOSIS — I10 Essential (primary) hypertension: Secondary | ICD-10-CM

## 2012-04-12 DIAGNOSIS — I482 Chronic atrial fibrillation, unspecified: Secondary | ICD-10-CM

## 2012-04-12 NOTE — Progress Notes (Signed)
Michelle Mccullough Date of Birth: 11/04/1918   History of Present Illness:  [name] is seen today for followup of her atrial fibrillation. She is now 78 years old. She has a history of permanent atrial fibrillation and has been treated with rate control and anticoagulation with Coumadin. She reports that several weeks ago she awoke with numbness and weakness of her right hand and arm. She states she couldn't do anything with it. She put it in some warm water and states that it got better. Her son reports that it took several days for her function to recover. She states it is now back to normal. She denies any dyspnea or chest pain. Her last 2 INRs were subtherapeutic at 1.4. Dosage has been increased.  Current Outpatient Prescriptions on File Prior to Visit  Medication Sig Dispense Refill  . Calcium-Magnesium-Vitamin D (CALCIUM MAGNESIUM PO) Take by mouth.        . Cholecalciferol (VITAMIN D PO) Take by mouth.        . diltiazem (CARDIZEM CD) 120 MG 24 hr capsule Take 1 capsule (120 mg total) by mouth daily.  30 capsule  1  . dorzolamide-timolol (COSOPT) 22.3-6.8 MG/ML ophthalmic solution 1 drop 2 (two) times daily.        Marland Kitchen GLUCOSAMINE PO Take by mouth.        . latanoprost (XALATAN) 0.005 % ophthalmic solution 1 drop at bedtime.        . Multiple Vitamins-Minerals (MACULAR VITAMIN BENEFIT PO) Take by mouth.        . Probiotic Product (PROBIOTIC PO) Take by mouth.        . warfarin (COUMADIN) 5 MG tablet Take 1 tablet (5 mg total) by mouth as directed.  30 tablet  3    No Known Allergies  Past Medical History  Diagnosis Date  . Chronic atrial fibrillation   . HTN (hypertension)   . Pneumonia   . CAD (coronary artery disease)     nonobstructive by cath in 2002  . Glaucoma(365)     Past Surgical History  Procedure Date  . Cataract extraction   . Colon surgery     x2 for diverticular disease and adhesions  . Abdominal hysterectomy   . Hemorrhoidectomy   . Dilation and  curettage of uterus     History  Smoking status  . Never Smoker   Smokeless tobacco  . Not on file    History  Alcohol Use No    History reviewed. No pertinent family history.  Review of Systems: As per history of present illness.  All other systems were reviewed and are negative.   Physical Exam: BP 154/86  Pulse 70  Ht 5\' 1"  (1.549 m)  Wt 101 lb 12.8 oz (46.176 kg)  BMI 19.23 kg/m2 She is an elderly white female in no acute distress. Her HEENT exam is unremarkable. She does have diminished vision related to glaucoma. Neck is supple no JVD, adenopathy, thyromegaly, or bruits. Lungs are clear. Cardiac exam reveals an irregular rate and rhythm with a grade 1/6 systolic outflow murmur. Abdomen is soft and nontender. She has no edema. Pulses are 2+ and symmetric. She is alert and fully oriented. Cranial nerves II through XII are intact. She has normal strength and tone in her right arm and hand. No sensory deficits.  LABORATORY DATA: ECG demonstrates atrial fibrillation with a rate of 73 beats per minute. It is otherwise normal.  Assessment / Plan: 1. Atrial fibrillation, permanent. Rate  is well controlled. Continue diltiazem.  2. Hypertension, controlled.  3. Recent left hand/arm weakness and numbness. I suspect she had a small stroke. She has recovered well. We stressed the importance of maintaining a therapeutic INR and I have discussed this with her Coumadin clinic. She had normal carotid Doppler studies in 2007. Given her advanced age I don't feel that we need to pursue further imaging at this time.

## 2012-04-12 NOTE — Patient Instructions (Addendum)
Continue your current medication  We will work on getting your coumadin into a therapeutic range.  I will see you in one year

## 2012-04-18 ENCOUNTER — Ambulatory Visit: Payer: Self-pay | Admitting: Cardiology

## 2012-04-18 DIAGNOSIS — I482 Chronic atrial fibrillation, unspecified: Secondary | ICD-10-CM

## 2012-05-01 LAB — PROTIME-INR: INR: 2.1 — AB (ref 0.9–1.1)

## 2012-05-02 ENCOUNTER — Ambulatory Visit: Payer: Self-pay | Admitting: Cardiology

## 2012-05-02 DIAGNOSIS — I482 Chronic atrial fibrillation, unspecified: Secondary | ICD-10-CM

## 2012-05-16 ENCOUNTER — Other Ambulatory Visit: Payer: Self-pay

## 2012-05-16 MED ORDER — DILTIAZEM HCL ER COATED BEADS 120 MG PO CP24: 120.0000 mg | ORAL_CAPSULE | Freq: Every day | ORAL | Status: DC

## 2012-05-22 LAB — PROTIME-INR: INR: 2.2 — AB (ref 0.9–1.1)

## 2012-05-23 ENCOUNTER — Ambulatory Visit: Payer: Self-pay | Admitting: Cardiovascular Disease

## 2012-05-23 DIAGNOSIS — I482 Chronic atrial fibrillation, unspecified: Secondary | ICD-10-CM

## 2012-05-30 ENCOUNTER — Other Ambulatory Visit: Payer: Self-pay | Admitting: *Deleted

## 2012-05-30 MED ORDER — WARFARIN SODIUM 5 MG PO TABS: ORAL_TABLET | ORAL | Status: DC

## 2012-06-21 ENCOUNTER — Ambulatory Visit: Payer: Self-pay | Admitting: Cardiology

## 2012-06-21 DIAGNOSIS — I482 Chronic atrial fibrillation, unspecified: Secondary | ICD-10-CM

## 2012-07-18 LAB — PROTIME-INR: INR: 2.4 — AB (ref 0.9–1.1)

## 2012-07-19 ENCOUNTER — Ambulatory Visit (INDEPENDENT_AMBULATORY_CARE_PROVIDER_SITE_OTHER): Payer: Medicare Other | Admitting: Cardiology

## 2012-07-19 ENCOUNTER — Other Ambulatory Visit: Payer: Self-pay | Admitting: *Deleted

## 2012-07-19 DIAGNOSIS — I4891 Unspecified atrial fibrillation: Secondary | ICD-10-CM

## 2012-07-19 DIAGNOSIS — I482 Chronic atrial fibrillation, unspecified: Secondary | ICD-10-CM

## 2012-07-19 MED ORDER — DILTIAZEM HCL ER COATED BEADS 120 MG PO CP24: 120.0000 mg | ORAL_CAPSULE | Freq: Every day | ORAL | Status: DC

## 2012-08-15 LAB — PROTIME-INR: INR: 2.6 — AB (ref 0.9–1.1)

## 2012-08-16 ENCOUNTER — Ambulatory Visit (INDEPENDENT_AMBULATORY_CARE_PROVIDER_SITE_OTHER): Payer: Medicare Other | Admitting: Cardiology

## 2012-08-16 DIAGNOSIS — I4891 Unspecified atrial fibrillation: Secondary | ICD-10-CM

## 2012-08-16 DIAGNOSIS — I482 Chronic atrial fibrillation, unspecified: Secondary | ICD-10-CM

## 2012-09-13 ENCOUNTER — Ambulatory Visit (INDEPENDENT_AMBULATORY_CARE_PROVIDER_SITE_OTHER): Payer: Medicare Other | Admitting: Internal Medicine

## 2012-09-13 DIAGNOSIS — I482 Chronic atrial fibrillation, unspecified: Secondary | ICD-10-CM

## 2012-09-13 DIAGNOSIS — I4891 Unspecified atrial fibrillation: Secondary | ICD-10-CM

## 2012-09-27 LAB — PROTIME-INR: INR: 3 — AB (ref 0.9–1.1)

## 2012-09-29 ENCOUNTER — Ambulatory Visit (INDEPENDENT_AMBULATORY_CARE_PROVIDER_SITE_OTHER): Payer: Medicare Other | Admitting: Cardiovascular Disease

## 2012-09-29 DIAGNOSIS — I482 Chronic atrial fibrillation, unspecified: Secondary | ICD-10-CM

## 2012-09-29 DIAGNOSIS — I4891 Unspecified atrial fibrillation: Secondary | ICD-10-CM

## 2012-10-11 LAB — PROTIME-INR: INR: 1.9 — AB (ref 0.9–1.1)

## 2012-10-12 ENCOUNTER — Ambulatory Visit (INDEPENDENT_AMBULATORY_CARE_PROVIDER_SITE_OTHER): Payer: Medicare Other | Admitting: Internal Medicine

## 2012-10-12 DIAGNOSIS — I482 Chronic atrial fibrillation, unspecified: Secondary | ICD-10-CM

## 2012-10-12 DIAGNOSIS — I4891 Unspecified atrial fibrillation: Secondary | ICD-10-CM

## 2012-10-25 LAB — PROTIME-INR: INR: 2.3 — AB (ref 0.9–1.1)

## 2012-10-26 ENCOUNTER — Ambulatory Visit (INDEPENDENT_AMBULATORY_CARE_PROVIDER_SITE_OTHER): Payer: Medicare Other | Admitting: Internal Medicine

## 2012-10-26 DIAGNOSIS — I482 Chronic atrial fibrillation, unspecified: Secondary | ICD-10-CM

## 2012-10-26 DIAGNOSIS — I4891 Unspecified atrial fibrillation: Secondary | ICD-10-CM

## 2012-11-13 ENCOUNTER — Other Ambulatory Visit: Payer: Self-pay | Admitting: *Deleted

## 2012-11-13 MED ORDER — WARFARIN SODIUM 5 MG PO TABS: ORAL_TABLET | ORAL | Status: DC

## 2012-11-27 ENCOUNTER — Ambulatory Visit (INDEPENDENT_AMBULATORY_CARE_PROVIDER_SITE_OTHER): Payer: Medicare Other | Admitting: Cardiology

## 2012-11-27 DIAGNOSIS — I4891 Unspecified atrial fibrillation: Secondary | ICD-10-CM

## 2012-11-27 DIAGNOSIS — I482 Chronic atrial fibrillation, unspecified: Secondary | ICD-10-CM

## 2012-12-14 ENCOUNTER — Telehealth: Payer: Self-pay

## 2012-12-14 ENCOUNTER — Encounter (HOSPITAL_COMMUNITY): Payer: Self-pay | Admitting: Emergency Medicine

## 2012-12-14 ENCOUNTER — Telehealth: Payer: Self-pay | Admitting: Cardiology

## 2012-12-14 ENCOUNTER — Inpatient Hospital Stay (HOSPITAL_COMMUNITY)
Admission: EM | Admit: 2012-12-14 | Discharge: 2013-01-01 | DRG: 177 | Disposition: E | Payer: Medicare Other | Attending: Internal Medicine | Admitting: Internal Medicine

## 2012-12-14 ENCOUNTER — Ambulatory Visit (INDEPENDENT_AMBULATORY_CARE_PROVIDER_SITE_OTHER): Payer: Medicare Other | Admitting: Cardiology

## 2012-12-14 ENCOUNTER — Emergency Department (HOSPITAL_COMMUNITY): Payer: Medicare Other

## 2012-12-14 DIAGNOSIS — I482 Chronic atrial fibrillation, unspecified: Secondary | ICD-10-CM

## 2012-12-14 DIAGNOSIS — H9190 Unspecified hearing loss, unspecified ear: Secondary | ICD-10-CM | POA: Diagnosis present

## 2012-12-14 DIAGNOSIS — Z515 Encounter for palliative care: Secondary | ICD-10-CM

## 2012-12-14 DIAGNOSIS — J9601 Acute respiratory failure with hypoxia: Secondary | ICD-10-CM | POA: Diagnosis present

## 2012-12-14 DIAGNOSIS — I4891 Unspecified atrial fibrillation: Secondary | ICD-10-CM | POA: Diagnosis present

## 2012-12-14 DIAGNOSIS — Z79899 Other long term (current) drug therapy: Secondary | ICD-10-CM

## 2012-12-14 DIAGNOSIS — J690 Pneumonitis due to inhalation of food and vomit: Principal | ICD-10-CM | POA: Diagnosis present

## 2012-12-14 DIAGNOSIS — R0902 Hypoxemia: Secondary | ICD-10-CM

## 2012-12-14 DIAGNOSIS — Z7901 Long term (current) use of anticoagulants: Secondary | ICD-10-CM

## 2012-12-14 DIAGNOSIS — J9600 Acute respiratory failure, unspecified whether with hypoxia or hypercapnia: Secondary | ICD-10-CM | POA: Diagnosis present

## 2012-12-14 DIAGNOSIS — A419 Sepsis, unspecified organism: Secondary | ICD-10-CM | POA: Diagnosis present

## 2012-12-14 DIAGNOSIS — I2510 Atherosclerotic heart disease of native coronary artery without angina pectoris: Secondary | ICD-10-CM | POA: Diagnosis present

## 2012-12-14 DIAGNOSIS — I10 Essential (primary) hypertension: Secondary | ICD-10-CM | POA: Diagnosis present

## 2012-12-14 DIAGNOSIS — F0390 Unspecified dementia without behavioral disturbance: Secondary | ICD-10-CM | POA: Diagnosis present

## 2012-12-14 DIAGNOSIS — H409 Unspecified glaucoma: Secondary | ICD-10-CM | POA: Diagnosis present

## 2012-12-14 DIAGNOSIS — J189 Pneumonia, unspecified organism: Secondary | ICD-10-CM | POA: Diagnosis present

## 2012-12-14 DIAGNOSIS — Z66 Do not resuscitate: Secondary | ICD-10-CM | POA: Diagnosis present

## 2012-12-14 LAB — CBC
HCT: 37.9 % (ref 36.0–46.0)
MCH: 31.9 pg (ref 26.0–34.0)
MCHC: 33.5 g/dL (ref 30.0–36.0)
MCV: 95.2 fL (ref 78.0–100.0)
Platelets: 201 10*3/uL (ref 150–400)
RDW: 16 % — ABNORMAL HIGH (ref 11.5–15.5)

## 2012-12-14 LAB — DIFFERENTIAL
Basophils Absolute: 0 10*3/uL (ref 0.0–0.1)
Eosinophils Absolute: 0 10*3/uL (ref 0.0–0.7)
Lymphs Abs: 1 10*3/uL (ref 0.7–4.0)
Monocytes Absolute: 1.1 10*3/uL — ABNORMAL HIGH (ref 0.1–1.0)
Monocytes Relative: 8 % (ref 3–12)
Neutro Abs: 12.1 10*3/uL — ABNORMAL HIGH (ref 1.7–7.7)

## 2012-12-14 LAB — COMPREHENSIVE METABOLIC PANEL
AST: 47 U/L — ABNORMAL HIGH (ref 0–37)
Albumin: 3 g/dL — ABNORMAL LOW (ref 3.5–5.2)
Alkaline Phosphatase: 61 U/L (ref 39–117)
BUN: 29 mg/dL — ABNORMAL HIGH (ref 6–23)
Chloride: 98 mEq/L (ref 96–112)
Creatinine, Ser: 0.64 mg/dL (ref 0.50–1.10)
Potassium: 5.2 mEq/L — ABNORMAL HIGH (ref 3.5–5.1)
Total Bilirubin: 1.1 mg/dL (ref 0.3–1.2)
Total Protein: 7.1 g/dL (ref 6.0–8.3)

## 2012-12-14 LAB — PROTIME-INR: INR: 2.73 — ABNORMAL HIGH (ref 0.00–1.49)

## 2012-12-14 MED ORDER — VANCOMYCIN HCL IN DEXTROSE 1-5 GM/200ML-% IV SOLN
1000.0000 mg | Freq: Once | INTRAVENOUS | Status: AC
  Administered 2012-12-14: 1000 mg via INTRAVENOUS
  Filled 2012-12-14: qty 200

## 2012-12-14 MED ORDER — DILTIAZEM HCL 100 MG IV SOLR
5.0000 mg/h | INTRAVENOUS | Status: DC
  Administered 2012-12-14 – 2012-12-15 (×2): 5 mg/h via INTRAVENOUS
  Filled 2012-12-14: qty 100

## 2012-12-14 MED ORDER — VANCOMYCIN HCL 10 G IV SOLR
1.0000 g | Freq: Once | INTRAVENOUS | Status: DC
Start: 2012-12-14 — End: 2012-12-14
  Filled 2012-12-14: qty 1000

## 2012-12-14 MED ORDER — PIPERACILLIN SOD-TAZOBACTAM SO 2.25 (2-0.25) G IV SOLR
3.3750 g | Freq: Once | INTRAVENOUS | Status: AC
  Administered 2012-12-14: 3.375 g via INTRAVENOUS
  Filled 2012-12-14: qty 3.38

## 2012-12-14 MED ORDER — DILTIAZEM LOAD VIA INFUSION
5.0000 mg | Freq: Once | INTRAVENOUS | Status: AC
  Administered 2012-12-14: 5 mg via INTRAVENOUS
  Filled 2012-12-14: qty 5

## 2012-12-14 MED ORDER — ACETAMINOPHEN 650 MG RE SUPP
650.0000 mg | Freq: Once | RECTAL | Status: AC
  Administered 2012-12-14: 650 mg via RECTAL
  Filled 2012-12-14: qty 1

## 2012-12-14 NOTE — ED Notes (Signed)
Pt from [facility].

## 2012-12-14 NOTE — ED Notes (Signed)
Per EMS: Nonproductive cough onset a couple days ago; this morning cough worsened and shortness of breath developed. Crackles with auscultation.

## 2012-12-14 NOTE — ED Notes (Signed)
Bed: WA02 Expected date:  Expected time:  Means of arrival:  Comments: ems-SOB

## 2012-12-14 NOTE — Telephone Encounter (Signed)
New Prob     Pts son concerned about his mother. States her BP 142/80, Pulse ranging from 80-120, oxygen 85. Pts son would like to speak to the nurse regarding this.

## 2012-12-14 NOTE — H&P (Addendum)
[facility]                                                                                    Patient Demographics  Michelle Mccullough, is a 79 y.o. female  CSN: [id number]  MRN: [id number]  DOB - [birth year]  Admit Date - Dec 30, 2012  Outpatient Primary MD for the patient is Michelle Sauer, MD   With History of -  Past Medical History  Diagnosis Date  . Chronic atrial fibrillation   . HTN (hypertension)   . Pneumonia   . CAD (coronary artery disease)     nonobstructive by cath in 2002  . Glaucoma       Past Surgical History  Procedure Laterality Date  . Cataract extraction    . Colon surgery      x2 for diverticular disease and adhesions  . Abdominal hysterectomy    . Hemorrhoidectomy    . Dilation and curettage of uterus      in for   Chief Complaint  Patient presents with  . Shortness of Breath  . Cough     HPI  Michelle Mccullough  is a 79 y.o. female, with a past medical history significant for active fibrillation on Coumadin,  coronary artery disease and hypertension who was brought today from the nursing home for evaluation of shortness of breath, and hypoxemia. Her son yesterday Michelle Mccullough noted that she might have been disturbed breath while talking to her on the phone, but today she was confused and more short of breath and her pulse ox was 84% and the nursing home where she stays. In the emergency room the patient was noted to be in A. fib with rapid ventricular rate and she was noted to have bilateral pneumonia with left sided white out. Patient is hard of hearing.   Review of Systems    In addition to the HPI above,  No Fever-chills, No Headache, No changes with Vision or hearing, No problems swallowing food or Liquids, No Chest pain, Cough  No Abdominal pain, No Nausea or Vommitting, Bowel movements are regular, No Blood in stool or Urine, No dysuria, No new skin rashes or bruises, No new joints pains-aches,  No new weakness,  tingling, numbness in any extremity, No recent weight gain or loss, No polyuria, polydypsia or polyphagia, No significant Mental Stressors.  A full 10 point Review of Systems was done, except as stated above, all other Review of Systems were negative.   Social History History  Substance Use Topics  . Smoking status: Never Smoker   . Smokeless tobacco: Not on file  . Alcohol Use: No     Family History Significant for heart disease.  Prior to Admission medications   Medication Sig Start Date End Date Taking? Authorizing Provider  brimonidine (ALPHAGAN) 0.15 % ophthalmic solution Place 1 drop into both eyes 2 (two) times daily.   Yes Historical Provider, MD  Calcium-Magnesium-Vitamin D (CALCIUM MAGNESIUM PO) Take 1 tablet by mouth daily.    Yes Historical Provider, MD  cholecalciferol (VITAMIN D) 1000 UNITS tablet Take 1,000 Units by mouth daily.   Yes Historical Provider, MD  diltiazem (DILACOR XR) 180 MG 24  hr capsule Take 180 mg by mouth daily.   Yes Historical Provider, MD  dorzolamide-timolol (COSOPT) 22.3-6.8 MG/ML ophthalmic solution 1 drop 2 (two) times daily.     Yes Historical Provider, MD  glucosamine-chondroitin 500-400 MG tablet Take 1 tablet by mouth 2 (two) times daily.   Yes Historical Provider, MD  latanoprost (XALATAN) 0.005 % ophthalmic solution 1 drop at bedtime.     Yes Historical Provider, MD  Multiple Vitamin (MULTIVITAMIN WITH MINERALS) TABS tablet Take 1 tablet by mouth 2 (two) times daily.   Yes Historical Provider, MD  warfarin (COUMADIN) 5 MG tablet Take 2.5-5 mg by mouth daily. Takes 2.5mg  daily EXCEPT on Sundays Wednesdays and Fridays she takes 5mg    Yes Historical Provider, MD    No Known Allergies  Physical Exam  Vitals  Blood pressure 100/57, pulse 98, temperature 103.9 F (39.9 C), temperature source Rectal, resp. rate 22, SpO2 95.00%.   1. General elderly white female lying in bed, very weak and tired  2. Normal affect and insight,   confused  3. No F.N deficits, ALL C.Nerves Intact, Strength 5/5 all 4 extremities, Sensation intact all 4 extremities, Plantars down going.  4. Ears and Eyes appear Normal, Conjunctivae clear, PERRLA. Moist Oral Mucosa.  5. Supple Neck, No JVD, No cervical lymphadenopathy appriciated, No Carotid Bruits.  6. Symmetrical Chest wall movement, poor inspiratory effort with bilateral rales  7. RRR, tachycardic  8. Positive Bowel Sounds, Abdomen Soft, Non tender, No organomegaly appriciated,No rebound -guarding or rigidity.  9.  No Cyanosis, Normal Skin Turgor, No Skin Rash or Bruise.  10. Good muscle tone,  joints appear normal , no effusions, Normal ROM.  11. No Palpable Lymph Nodes in Neck or Axillae    Data Review  CBC  Recent Labs Lab 2013/01/09 2000  WBC 14.2*  HGB 12.7  HCT 37.9  PLT 201  MCV 95.2  MCH 31.9  MCHC 33.5  RDW 16.0*  LYMPHSABS 1.0  MONOABS 1.1*  EOSABS 0.0  BASOSABS 0.0   ------------------------------------------------------------------------------------------------------------------  Chemistries   Recent Labs Lab Jan 09, 2013 1900  NA 132*  K 5.2*  CL 98  CO2 24  GLUCOSE 190*  BUN 29*  CREATININE 0.64  CALCIUM 9.1  AST 47*  ALT 24  ALKPHOS 61  BILITOT 1.1   ------------------------------------------------------------------------------------------------------------------  Coagulation profile  Recent Labs Lab Jan 09, 2013 01-09-2013 1900  INR 3.2* 2.73*   -------------------------------------------------------------------------------------------------------------------  ---------------------------------------------------------------------------------------------------------------    ----------------------------------------------------------------------------------------------------------------   Imaging results:   Dg Chest Portable 1 View  Jan 09, 2013   *RADIOLOGY REPORT*  Clinical Data: Shortness of breath and cough.  PORTABLE CHEST  - 1 VIEW  Comparison: 11/22/2005  Findings: Moderate osteopenia.  Left greater than right glenohumeral joint osteoarthritis.  Moderate cardiomegaly with atherosclerosis in the transverse aorta.  Probable left-sided pleural effusion. No pneumothorax.  Dense opacification of nearly the entire left lung with relative sparing of the left apex. Patchy right-sided airspace disease.  IMPRESSION: Patchy right-sided airspace disease with near complete opacification of the left hemithorax.  Favored to represent multifocal infection/aspiration.  Asymmetric pulmonary edema felt less likely.  Underlying neoplasm cannot be excluded in the left lung.  Radiographic follow-up until clearing is recommended.  Possible left-sided pleural effusion.   Original Report Authenticated By: Jeronimo Greaves, M.D.    My personal review of EKG: A. fib at a rate of 88, old anterior infarct.   Assessment & Plan  1. bilateral pneumonia, probably aspiration with left lung whiteout. We'll keep the patient n.p.o. and start  IV antibiotics. Chest physiotherapy was ordered, sputum and blood cultures ordered. Discussed with pulmonary. Needs speech therapy evaluation in a.m. will need a CT of the chest when she is more stable.  2. A fib with rapid ventricular rate; rate now is 88 continue with Cardizem drip and metoprolol IV. Continue with hydration  3. Hypertension; continue with medications  4. CAD; asymptomatic; check serial troponins  5. Glaucoma continue with eyedrops   DVT Prophylaxis Lovenox  AM Labs Ordered, also please review Full Orders  Family Communication: Admission, patients condition and plan of care including tests being ordered have been discussed with the patient and son who indicate understanding and agree with the plan and Code Status.  Code Status DO NOT RESUSCITATE  Disposition Plan: Nursing home  Time spent in minutes : 50 minutes  Condition GUARDED

## 2012-12-14 NOTE — Telephone Encounter (Signed)
Returned call to son spoke to Michelle Mccullough at [facility] earlier today she stated patient came up to visit her husband and she was weak.Michelle Mccullough stated patient took metamucil last week and had diarrhea.Stated patient has not been drinking or eating.Stated she had lunch and was starting to drink more.Stated she was going to play bridge after lunch.Michelle Mccullough will check to see if patient is taking diltiazem 120 mg daily.   Michelle Mccullough called she stated she went to check on patient this afternoon after she played bridge.Stated she was very sob, breathing labored.Stated EMS was called and patient was taken to [facility] ER.

## 2012-12-14 NOTE — Telephone Encounter (Signed)
Received call from [facility] spoke to April Holding she stated she needed a order for a stat pt to be done today.Stated patient is due a pt today but they wanted a stat pt due to patient not feeling wee.Stated patient feels weak,B/P ranging 136/91 to 142/80,heart rate ranging 70 to 119 beats/min.Spoke to Dr.Jordan ok to get a stat pt.

## 2012-12-14 NOTE — Progress Notes (Signed)
CSW visited pt at bedside.  Pt was sleeping.  Her daughter in law who is at her bedside confirms that pt is a resident at [facility] and that the family plans for her to return once she is medically stable.  Pts sons Aijalon Demuro [phone] and Sanaz Scarlett [phone] share HCPOA.  Marva Panda, Theresia Majors  [phone]  .12/25/2012 9:31 pm

## 2012-12-14 NOTE — ED Provider Notes (Signed)
CSN: [id number]     Arrival date & time 12/13/2012  1746 History     First MD Initiated Contact with Patient 12/02/2012 1756     Chief Complaint  Patient presents with  . Shortness of Breath  . Cough   Level V caveat for confusion  (Consider location/radiation/quality/duration/timing/severity/associated sxs/prior Treatment) HPI  History obtained from patient's son. He states yesterday his mother sounded short of breath when I talked to her on the phone. Today at her facility they noted she was getting more short of breath. At about 3 PM she started having a cough. He reports they checked her pulse ox at noon in her pulse ox was 85% on room air. She was noted to have a heart rate of 80-120. He states he has a history of atrial fib which he thought was intermittent. He denies been aware of fever. Patient denies chest pain. He states her legs do not appear more swollen than usual. He states she seemed confused today and it seems to beginning worse over the past month.  PCP Dr Felipa Eth Cardiologist Dr [place]   patient is DO NOT RESUSCITATE  Past Medical History  Diagnosis Date  . Chronic atrial fibrillation   . HTN (hypertension)   . Pneumonia   . CAD (coronary artery disease)     nonobstructive by cath in 2002  . Glaucoma    Past Surgical History  Procedure Laterality Date  . Cataract extraction    . Colon surgery      x2 for diverticular disease and adhesions  . Abdominal hysterectomy    . Hemorrhoidectomy    . Dilation and curettage of uterus     History reviewed. No pertinent family history. History  Substance Use Topics  . Smoking status: Never Smoker   . Smokeless tobacco: Not on file  . Alcohol Use: No  lives in independent living Husband lives in SNF  [place] History   Grav Para Term Preterm Abortions TAB SAB Ect Mult Living                 Review of Systems  Unable to perform ROS: Mental status change    Allergies  Review of patient's allergies indicates no known  allergies.  Home Medications   Current Outpatient Rx  Name  Route  Sig  Dispense  Refill  . brimonidine (ALPHAGAN) 0.15 % ophthalmic solution   Both Eyes   Place 1 drop into both eyes 2 (two) times daily.         . Calcium-Magnesium-Vitamin D (CALCIUM MAGNESIUM PO)   Oral   Take 1 tablet by mouth daily.          . cholecalciferol (VITAMIN D) 1000 UNITS tablet   Oral   Take 1,000 Units by mouth daily.         Marland Kitchen diltiazem (DILACOR XR) 180 MG 24 hr capsule   Oral   Take 180 mg by mouth daily.         . dorzolamide-timolol (COSOPT) 22.3-6.8 MG/ML ophthalmic solution      1 drop 2 (two) times daily.           Marland Kitchen glucosamine-chondroitin 500-400 MG tablet   Oral   Take 1 tablet by mouth 2 (two) times daily.         Marland Kitchen latanoprost (XALATAN) 0.005 % ophthalmic solution      1 drop at bedtime.           . Multiple Vitamin (MULTIVITAMIN WITH MINERALS)  TABS tablet   Oral   Take 1 tablet by mouth 2 (two) times daily.         Marland Kitchen warfarin (COUMADIN) 5 MG tablet   Oral   Take 2.5-5 mg by mouth daily. Takes 2.5mg  daily EXCEPT on Sundays Wednesdays and Fridays she takes 5mg           BP 127/77  Pulse 108  Temp(Src) 103.9 F (39.9 C) (Rectal)  Resp 34  SpO2 96%  Vital signs normal except tachycardia and fever  Physical Exam  Nursing note and vitals reviewed. Constitutional: She is oriented to person, place, and time.  Non-toxic appearance. She does not appear ill. She appears distressed.  Frail elderly female is very hard of hearing and slow to respond to questions although she is awake  HENT:  Head: Normocephalic and atraumatic.  Right Ear: External ear normal.  Left Ear: External ear normal.  Nose: Nose normal. No mucosal edema or rhinorrhea.  Mouth/Throat: Oropharynx is clear and moist and mucous membranes are normal. No dental abscesses or edematous.  Eyes: Conjunctivae and EOM are normal. Pupils are equal, round, and reactive to light.  Neck: Normal  range of motion and full passive range of motion without pain. Neck supple.  Cardiovascular: Normal rate, regular rhythm and normal heart sounds.  Exam reveals no gallop and no friction rub.   No murmur heard. Pulmonary/Chest: Effort normal. No respiratory distress. She has no wheezes. She has no rhonchi. She has rales. She exhibits no tenderness and no crepitus.  Diffuse rales at the bases, coughing frequently  Abdominal: Soft. Normal appearance and bowel sounds are normal. She exhibits no distension. There is no tenderness. There is no rebound and no guarding.  Musculoskeletal: Normal range of motion. She exhibits no edema and no tenderness.  Moves all extremities well.   Neurological: She is alert and oriented to person, place, and time. She has normal strength. No cranial nerve deficit.  Skin: Skin is warm, dry and intact. No rash noted. No erythema. No pallor.  Psychiatric: Her speech is normal and behavior is normal. Her mood appears not anxious.  Flat affect    ED Course   Medications  diltiazem (CARDIZEM) 100 mg in dextrose 5 % 100 mL infusion (5 mg/hr Intravenous New Bag/Given 12/08/2012 1916)  diltiazem (CARDIZEM) 1 mg/mL load via infusion 5 mg (0 mg Intravenous Stopped 12/03/2012 1917)  acetaminophen (TYLENOL) suppository 650 mg (650 mg Rectal Given 12/11/2012 1839)  piperacillin-tazobactam (ZOSYN) 3.375 g in dextrose 5 % 50 mL IVPB (0 g Intravenous Stopped 12/25/2012 2116)  vancomycin (VANCOCIN) IVPB 1000 mg/200 mL premix (1,000 mg Intravenous New Bag/Given 12/19/2012 2141)     Procedures (including critical care time)  Pt noted to be in atrial fib on her cardiac monitor up to 130's.   Patient started on Cardizem bolus and drip for her atrial fibrilliation with fast ventricular response. Patient was started on antibiotics for pneumonia. She possibly has aspiration pneumonia or community acquired pneumonia however she resides in a nursing facility so she was treated with healthcare  associated pneumonia antibiotics.   22:47 Dr Sharyon Medicus, admit to step dow team 8  Results for orders placed during the hospital encounter of 12/30/2012  COMPREHENSIVE METABOLIC PANEL      Result Value Range   Sodium 132 (*) 135 - 145 mEq/L   Potassium 5.2 (*) 3.5 - 5.1 mEq/L   Chloride 98  96 - 112 mEq/L   CO2 24  19 - 32 mEq/L  Glucose, Bld 190 (*) 70 - 99 mg/dL   BUN 29 (*) 6 - 23 mg/dL   Creatinine, Ser 1.61  0.50 - 1.10 mg/dL   Calcium 9.1  8.4 - 09.6 mg/dL   Total Protein 7.1  6.0 - 8.3 g/dL   Albumin 3.0 (*) 3.5 - 5.2 g/dL   AST 47 (*) 0 - 37 U/L   ALT 24  0 - 35 U/L   Alkaline Phosphatase 61  39 - 117 U/L   Total Bilirubin 1.1  0.3 - 1.2 mg/dL   GFR calc non Af Amer 75 (*) >90 mL/min   GFR calc Af Amer 87 (*) >90 mL/min  PROTIME-INR      Result Value Range   Prothrombin Time 28.0 (*) 11.6 - 15.2 seconds   INR 2.73 (*) 0.00 - 1.49  CBC      Result Value Range   WBC 14.2 (*) 4.0 - 10.5 K/uL   RBC 3.98  3.87 - 5.11 MIL/uL   Hemoglobin 12.7  12.0 - 15.0 g/dL   HCT 04.5  40.9 - 81.1 %   MCV 95.2  78.0 - 100.0 fL   MCH 31.9  26.0 - 34.0 pg   MCHC 33.5  30.0 - 36.0 g/dL   RDW 91.4 (*) 78.2 - 95.6 %   Platelets 201  150 - 400 K/uL  DIFFERENTIAL      Result Value Range   Neutrophils Relative % 85 (*) 43 - 77 %   Lymphocytes Relative 7 (*) 12 - 46 %   Monocytes Relative 8  3 - 12 %   Eosinophils Relative 0  0 - 5 %   Basophils Relative 0  0 - 1 %   Neutro Abs 12.1 (*) 1.7 - 7.7 K/uL   Lymphs Abs 1.0  0.7 - 4.0 K/uL   Monocytes Absolute 1.1 (*) 0.1 - 1.0 K/uL   Eosinophils Absolute 0.0  0.0 - 0.7 K/uL   Basophils Absolute 0.0  0.0 - 0.1 K/uL   WBC Morphology MILD LEFT SHIFT (1-5% METAS, OCC MYELO, OCC BANDS)     Laboratory interpretation all normal except hyperglycemia, therapeutic INR   Dg Chest Portable 1 View  12/22/2012   *RADIOLOGY REPORT*  Clinical Data: Shortness of breath and cough.  PORTABLE CHEST - 1 VIEW  Comparison: 11/22/2005  Findings: Moderate  osteopenia.  Left greater than right glenohumeral joint osteoarthritis.  Moderate cardiomegaly with atherosclerosis in the transverse aorta.  Probable left-sided pleural effusion. No pneumothorax.  Dense opacification of nearly the entire left lung with relative sparing of the left apex. Patchy right-sided airspace disease.  IMPRESSION: Patchy right-sided airspace disease with near complete opacification of the left hemithorax.  Favored to represent multifocal infection/aspiration.  Asymmetric pulmonary edema felt less likely.  Underlying neoplasm cannot be excluded in the left lung.  Radiographic follow-up until clearing is recommended.  Possible left-sided pleural effusion.   Original Report Authenticated By: Jeronimo Greaves, M.D.    Date: 12/20/2012  Rate: 83  On diltiazem drip  Rhythm: atrial fibrillation  QRS Axis: normal  Intervals: normal  ST/T Wave abnormalities: nonspecific T wave changes  Conduction Disutrbances:none  Narrative Interpretation: low voltage  Old EKG Reviewed: none available    1. Hypoxia   2. Healthcare-associated pneumonia   3. Atrial fibrillation     Plan admission   Devoria Albe, MD, FACEP   CRITICAL CARE Performed by: Devoria Albe L Total critical care time: 32 min Critical care time was exclusive of separately billable  procedures and treating other patients. Critical care was necessary to treat or prevent imminent or life-threatening deterioration. Critical care was time spent personally by me on the following activities: development of treatment plan with patient and/or surrogate as well as nursing, discussions with consultants, evaluation of patient's response to treatment, examination of patient, obtaining history from patient or surrogate, ordering and performing treatments and interventions, ordering and review of laboratory studies, ordering and review of radiographic studies, pulse oximetry and re-evaluation of patient's condition.     MDM    Ward Givens, MD 12/15/12 [phone]

## 2012-12-14 NOTE — Telephone Encounter (Signed)
Error

## 2012-12-14 NOTE — ED Notes (Signed)
Pt unable to urinate at this time. Family informed to ring call bell when she states she has to go.

## 2012-12-15 ENCOUNTER — Encounter: Payer: Self-pay | Admitting: Cardiology

## 2012-12-15 ENCOUNTER — Encounter (HOSPITAL_COMMUNITY): Payer: Self-pay

## 2012-12-15 ENCOUNTER — Inpatient Hospital Stay (HOSPITAL_COMMUNITY): Payer: Medicare Other

## 2012-12-15 DIAGNOSIS — J189 Pneumonia, unspecified organism: Secondary | ICD-10-CM | POA: Diagnosis present

## 2012-12-15 DIAGNOSIS — J9601 Acute respiratory failure with hypoxia: Secondary | ICD-10-CM | POA: Diagnosis present

## 2012-12-15 DIAGNOSIS — R0902 Hypoxemia: Secondary | ICD-10-CM

## 2012-12-15 DIAGNOSIS — J9600 Acute respiratory failure, unspecified whether with hypoxia or hypercapnia: Secondary | ICD-10-CM

## 2012-12-15 LAB — URINALYSIS, ROUTINE W REFLEX MICROSCOPIC
Glucose, UA: NEGATIVE mg/dL
Leukocytes, UA: NEGATIVE
Nitrite: NEGATIVE
Specific Gravity, Urine: 1.026 (ref 1.005–1.030)
pH: 5.5 (ref 5.0–8.0)

## 2012-12-15 LAB — BASIC METABOLIC PANEL
BUN: 26 mg/dL — ABNORMAL HIGH (ref 6–23)
Calcium: 8.7 mg/dL (ref 8.4–10.5)
GFR calc Af Amer: 89 mL/min — ABNORMAL LOW (ref >90)
GFR calc non Af Amer: 77 mL/min — ABNORMAL LOW (ref >90)
Potassium: 4.3 mEq/L (ref 3.5–5.1)
Sodium: 134 mEq/L — ABNORMAL LOW (ref 135–145)

## 2012-12-15 LAB — GLUCOSE, CAPILLARY
Glucose-Capillary: 126 mg/dL — ABNORMAL HIGH (ref 70–99)
Glucose-Capillary: 148 mg/dL — ABNORMAL HIGH (ref 70–99)

## 2012-12-15 LAB — STREP PNEUMONIAE URINARY ANTIGEN: Strep Pneumo Urinary Antigen: NEGATIVE

## 2012-12-15 LAB — HEMOGLOBIN A1C: Mean Plasma Glucose: 120 mg/dL — ABNORMAL HIGH (ref <117)

## 2012-12-15 LAB — MRSA PCR SCREENING: MRSA by PCR: NEGATIVE

## 2012-12-15 MED ORDER — LORAZEPAM 2 MG/ML IJ SOLN
INTRAMUSCULAR | Status: AC
  Administered 2012-12-15: 1 mg via INTRAVENOUS
  Filled 2012-12-15: qty 1

## 2012-12-15 MED ORDER — ONDANSETRON HCL 4 MG/2ML IJ SOLN: 4.0000 mg | Freq: Four times a day (QID) | INTRAMUSCULAR | Status: DC | PRN

## 2012-12-15 MED ORDER — LORAZEPAM 2 MG/ML IJ SOLN: 1.0000 mg | Freq: Once | INTRAMUSCULAR | Status: AC

## 2012-12-15 MED ORDER — DORZOLAMIDE HCL-TIMOLOL MAL 2-0.5 % OP SOLN
1.0000 [drp] | Freq: Two times a day (BID) | OPHTHALMIC | Status: DC
  Administered 2012-12-15: 1 [drp] via OPHTHALMIC
  Filled 2012-12-15: qty 10

## 2012-12-15 MED ORDER — WARFARIN SODIUM 2.5 MG PO TABS
2.5000 mg | ORAL_TABLET | Freq: Once | ORAL | Status: AC
  Administered 2012-12-15: 2.5 mg via ORAL
  Filled 2012-12-15: qty 1

## 2012-12-15 MED ORDER — ONDANSETRON HCL 4 MG PO TABS: 4.0000 mg | ORAL_TABLET | Freq: Four times a day (QID) | ORAL | Status: DC | PRN

## 2012-12-15 MED ORDER — WARFARIN - PHARMACIST DOSING INPATIENT: Freq: Every day | Status: DC

## 2012-12-15 MED ORDER — CHLORHEXIDINE GLUCONATE 0.12 % MT SOLN
15.0000 mL | Freq: Two times a day (BID) | OROMUCOSAL | Status: DC
  Administered 2012-12-15 – 2012-12-16 (×3): 15 mL via OROMUCOSAL
  Filled 2012-12-15 (×5): qty 15

## 2012-12-15 MED ORDER — METOPROLOL TARTRATE 1 MG/ML IV SOLN
2.5000 mg | Freq: Three times a day (TID) | INTRAVENOUS | Status: DC
  Administered 2012-12-15 – 2012-12-16 (×4): 2.5 mg via INTRAVENOUS
  Filled 2012-12-15 (×7): qty 5

## 2012-12-15 MED ORDER — VANCOMYCIN HCL IN DEXTROSE 750-5 MG/150ML-% IV SOLN
750.0000 mg | Freq: Every day | INTRAVENOUS | Status: DC
  Administered 2012-12-15: 750 mg via INTRAVENOUS
  Filled 2012-12-15: qty 150

## 2012-12-15 MED ORDER — ALBUTEROL SULFATE (5 MG/ML) 0.5% IN NEBU
2.5000 mg | INHALATION_SOLUTION | Freq: Four times a day (QID) | RESPIRATORY_TRACT | Status: DC
  Administered 2012-12-15 – 2012-12-16 (×5): 2.5 mg via RESPIRATORY_TRACT
  Filled 2012-12-15 (×5): qty 0.5

## 2012-12-15 MED ORDER — INSULIN ASPART 100 UNIT/ML ~~LOC~~ SOLN
0.0000 [IU] | Freq: Three times a day (TID) | SUBCUTANEOUS | Status: DC
  Administered 2012-12-15 – 2012-12-16 (×3): 1 [IU] via SUBCUTANEOUS

## 2012-12-15 MED ORDER — LATANOPROST 0.005 % OP SOLN
1.0000 [drp] | Freq: Every day | OPHTHALMIC | Status: DC
  Administered 2012-12-15: 1 [drp] via OPHTHALMIC
  Filled 2012-12-15: qty 2.5

## 2012-12-15 MED ORDER — BRIMONIDINE TARTRATE 0.15 % OP SOLN
1.0000 [drp] | Freq: Two times a day (BID) | OPHTHALMIC | Status: DC
  Administered 2012-12-15: 1 [drp] via OPHTHALMIC
  Filled 2012-12-15: qty 5

## 2012-12-15 MED ORDER — ALBUTEROL SULFATE (5 MG/ML) 0.5% IN NEBU
2.5000 mg | INHALATION_SOLUTION | RESPIRATORY_TRACT | Status: DC | PRN
  Administered 2012-12-16: 2.5 mg via RESPIRATORY_TRACT
  Filled 2012-12-15: qty 0.5

## 2012-12-15 MED ORDER — SODIUM CHLORIDE 0.9 % IV SOLN
INTRAVENOUS | Status: DC
  Administered 2012-12-15 – 2012-12-16 (×3): via INTRAVENOUS

## 2012-12-15 MED ORDER — DEXTROSE-NACL 5-0.9 % IV SOLN: INTRAVENOUS | Status: DC

## 2012-12-15 MED ORDER — PIPERACILLIN-TAZOBACTAM 3.375 G IVPB
3.3750 g | Freq: Three times a day (TID) | INTRAVENOUS | Status: DC
  Administered 2012-12-15 – 2012-12-16 (×4): 3.375 g via INTRAVENOUS
  Filled 2012-12-15 (×5): qty 50

## 2012-12-15 MED ORDER — SODIUM CHLORIDE 0.9 % IJ SOLN
3.0000 mL | Freq: Two times a day (BID) | INTRAMUSCULAR | Status: DC
  Administered 2012-12-15: 3 mL via INTRAVENOUS

## 2012-12-15 MED ORDER — BIOTENE DRY MOUTH MT LIQD
15.0000 mL | Freq: Two times a day (BID) | OROMUCOSAL | Status: DC
  Administered 2012-12-15 – 2012-12-16 (×4): 15 mL via OROMUCOSAL

## 2012-12-15 MED ORDER — HALOPERIDOL LACTATE 5 MG/ML IJ SOLN
1.0000 mg | Freq: Four times a day (QID) | INTRAMUSCULAR | Status: DC | PRN
  Administered 2012-12-15: 1 mg via INTRAVENOUS
  Filled 2012-12-15 (×2): qty 1

## 2012-12-15 MED ORDER — FUROSEMIDE 10 MG/ML IJ SOLN
20.0000 mg | Freq: Once | INTRAMUSCULAR | Status: AC
  Administered 2012-12-15: 20 mg via INTRAVENOUS

## 2012-12-15 MED ORDER — HALOPERIDOL LACTATE 5 MG/ML IJ SOLN
1.0000 mg | Freq: Once | INTRAMUSCULAR | Status: AC
  Administered 2012-12-15: 1 mg via INTRAVENOUS

## 2012-12-15 MED ORDER — FUROSEMIDE 10 MG/ML IJ SOLN
INTRAMUSCULAR | Status: AC
  Filled 2012-12-15: qty 4

## 2012-12-15 NOTE — Progress Notes (Signed)
Patient declined activation of MyChart-no computer

## 2012-12-15 NOTE — Progress Notes (Addendum)
[facility] PROGRESS NOTE  Michelle Mccullough ZOX:[id number] DOB: 1918/10/19 DOA: Dec 18, 2012 PCP: Hoyle Sauer, MD  Assessment/Plan: 1. Acute hypoxic resp failure -extensive L lung infiltrate, white out -continue VAnc.Zosyn -nebs/chest PT -repeat CXr today, will ask for PCCM assistance, may need Bronch -? Aspiration, check swallow eval -Fu blood and sputum CX -DNR  2. Dementia -stable  3. Afib: rate better controlled -on cardizem currently, resume PO agents after swallow eval and wean off gtt -coumadin  4. CAD -stable  5. DVt proph: lovenox  Code Status: DNR Family Communication:none at bedside, will call son later today  Disposition Plan: SNF when stable  Antibiotics:  VAnc/Zosyn  HPI/Subjective: I feel ok, still on NRM  Objective: Filed Vitals:   12/15/12 0800  BP: 123/59  Pulse:   Temp:   Resp: 32    Intake/Output Summary (Last 24 hours) at 12/15/12 1132 Last data filed at 12/15/12 0900  Gross per 24 hour  Intake    765 ml  Output      0 ml  Net    765 ml   Filed Weights   12/15/12 0000  Weight: 44.2 kg (97 lb 7.1 oz)    Exam:   General:  Alert, awake, oriented to self and place  Cardiovascular: S1S2/RRR  Respiratory: crcakles throughout both lungs  Abdomen:soft, Nt, BS present  Musculoskeletal: no edema c/c  Data Reviewed: Basic Metabolic Panel:  Recent Labs Lab 12/10/2012 1900 12/15/12 0335  NA 132* 134*  K 5.2* 4.3  CL 98 102  CO2 24 24  GLUCOSE 190* 138*  BUN 29* 26*  CREATININE 0.64 0.59  CALCIUM 9.1 8.7   Liver Function Tests:  Recent Labs Lab 12/17/2012 1900  AST 47*  ALT 24  ALKPHOS 61  BILITOT 1.1  PROT 7.1  ALBUMIN 3.0*   No results found for this basename: LIPASE, AMYLASE,  in the last 168 hours No results found for this basename: AMMONIA,  in the last 168 hours CBC:  Recent Labs Lab 12/26/2012 2000  WBC 14.2*  NEUTROABS 12.1*  HGB 12.7  HCT 37.9  MCV 95.2  PLT 201   Cardiac  Enzymes: No results found for this basename: CKTOTAL, CKMB, CKMBINDEX, TROPONINI,  in the last 168 hours BNP (last 3 results) No results found for this basename: PROBNP,  in the last 8760 hours CBG:  Recent Labs Lab 12/15/12 0733  GLUCAP 126*    Recent Results (from the past 240 hour(s))  CULTURE, BLOOD (ROUTINE X 2)     Status: None   Collection Time    18-Dec-2012  7:00 PM      Result Value Range Status   Specimen Description BLOOD LEFT ARM   Final   Special Requests BOTTLES DRAWN AEROBIC AND ANAEROBIC 4CC EACH   Final   Culture  Setup Time     Final   Value: 12/01/2012 23:23     Performed at [facility]   Culture     Final   Value:        BLOOD CULTURE RECEIVED NO GROWTH TO DATE CULTURE WILL BE HELD FOR 5 DAYS BEFORE ISSUING A FINAL NEGATIVE REPORT     Performed at [facility]   Report Status PENDING   Incomplete  CULTURE, BLOOD (ROUTINE X 2)     Status: None   Collection Time    12/09/2012  7:27 PM      Result Value Range Status   Specimen Description BLOOD RIGHT ARM   Final  Special Requests BOTTLES DRAWN AEROBIC AND ANAEROBIC [facility] EACH   Final   Culture  Setup Time     Final   Value: 12/19/2012 23:24     Performed at [facility]   Culture     Final   Value:        BLOOD CULTURE RECEIVED NO GROWTH TO DATE CULTURE WILL BE HELD FOR 5 DAYS BEFORE ISSUING A FINAL NEGATIVE REPORT     Performed at [facility]   Report Status PENDING   Incomplete  MRSA PCR SCREENING     Status: None   Collection Time    12/15/12 12:15 AM      Result Value Range Status   MRSA by PCR NEGATIVE  NEGATIVE Final   Comment:            The GeneXpert MRSA Assay (FDA     approved for NASAL specimens     only), is one component of a     comprehensive MRSA colonization     surveillance program. It is not     intended to diagnose MRSA     infection nor to guide or     monitor treatment for     MRSA infections.     Studies: Dg Chest Port 1 View  12/15/2012    *RADIOLOGY REPORT*  Clinical Data: Aspiration pneumonia.  Hypoxia.  Weakness.  PORTABLE CHEST - 1 VIEW  Comparison: 12/03/2012  Findings: Increased airspace opacity in the right upper lobe. Slightly improved aeration in the left mid lung.  There is consolidation of most the left lung.  Cannot exclude left pleural effusion as well.  Densely atherosclerotic thoracic aorta.  Degenerative glenohumeral arthropathy, left greater than right.  Left heart border is obscured but I would doubt that there is cardiomegaly.  Skin fold projects over the right hemithorax.  IMPRESSION:  1.  Worsened right upper lobe airspace opacity with slightly improved aeration in the left mid lung.  Extensive consolidation in the left lung persist.  Cannot exclude a concurrent left pleural effusion.  Appearance compatible with severe aspiration pneumonia. 2.  Atherosclerosis.   Original Report Authenticated By: Gaylyn Rong, M.D.   Dg Chest Portable 1 View  12/11/2012   *RADIOLOGY REPORT*  Clinical Data: Shortness of breath and cough.  PORTABLE CHEST - 1 VIEW  Comparison: 11/22/2005  Findings: Moderate osteopenia.  Left greater than right glenohumeral joint osteoarthritis.  Moderate cardiomegaly with atherosclerosis in the transverse aorta.  Probable left-sided pleural effusion. No pneumothorax.  Dense opacification of nearly the entire left lung with relative sparing of the left apex. Patchy right-sided airspace disease.  IMPRESSION: Patchy right-sided airspace disease with near complete opacification of the left hemithorax.  Favored to represent multifocal infection/aspiration.  Asymmetric pulmonary edema felt less likely.  Underlying neoplasm cannot be excluded in the left lung.  Radiographic follow-up until clearing is recommended.  Possible left-sided pleural effusion.   Original Report Authenticated By: Jeronimo Greaves, M.D.    Scheduled Meds: . albuterol  2.5 mg Nebulization Q6H  . antiseptic oral rinse  15 mL Mouth Rinse q12n4p   . brimonidine  1 drop Both Eyes BID  . chlorhexidine  15 mL Mouth Rinse BID  . dorzolamide-timolol  1 drop Both Eyes BID  . insulin aspart  0-9 Units Subcutaneous TID WC  . latanoprost  1 drop Both Eyes QHS  . metoprolol  2.5 mg Intravenous Q8H  . piperacillin-tazobactam (ZOSYN)  IV  3.375 g Intravenous Q8H  .  sodium chloride  3 mL Intravenous Q12H  . vancomycin  750 mg Intravenous QHS   Continuous Infusions: . sodium chloride 75 mL/hr at 12/15/12 1015  . diltiazem (CARDIZEM) infusion 5 mg/hr (12/15/12 1014)    Principal Problem:   Aspiration pneumonia Active Problems:   Chronic atrial fibrillation    Time spent:    [facility]  [facility] Pager [phone]. If 7PM-7AM, please contact night-coverage at [email or web address], password [facility] 12/15/2012, 11:32 AM  LOS: 1 day

## 2012-12-15 NOTE — Consult Note (Signed)
PULMONARY  / CRITICAL CARE MEDICINE  Name: Michelle Mccullough MRN: [id number] DOB: May 03, 1919    ADMISSION DATE:  12/26/2012 CONSULTATION DATE:  12/15/2012  REFERRING MD :  Rozanna Box Dr Jomarie Longs PRIMARY SERVICE:  [facility], PCP- [facility]  CHIEF COMPLAINT:  Lung collapse   HISTORY OF PRESENT ILLNESS:  79 year old nursing home resident admitted with extensive bilateral pneumonia. Year consulted for possibility of bronchoscopy for left lung collapse. She was brought into the emergency room for worsening dyspnea, hypoxia with sats of 84% and  noted to be febrile to 103 with a WBC count of 14. Chest x-ray showed extensive left-sided consolidation and patchy consolidation in the right upper lobe. On my review, there is no shift of the mediastinum. The left costophrenic angle is not visualized Overnight she is required a Ventimask, started on Cardizem drip for RVR PMH -atrial fibrillation on Coumadin, coronary artery disease and hypertension  Mobile at baseline with walker and denies coughing spells with eating or drinking   SIGNIFICANT EVENTS / STUDIES:    LINES / TUBES:   CULTURES: bld 8/14 >> Urine strep >> Urine legionella >>  ANTIBIOTICS: Zosyn 8/14>> Vanc 8/14 >    PAST MEDICAL HISTORY :  Past Medical History  Diagnosis Date  . Chronic atrial fibrillation   . HTN (hypertension)   . Pneumonia   . CAD (coronary artery disease)     nonobstructive by cath in 2002  . Glaucoma    Past Surgical History  Procedure Laterality Date  . Cataract extraction    . Colon surgery      x2 for diverticular disease and adhesions  . Abdominal hysterectomy    . Hemorrhoidectomy    . Dilation and curettage of uterus    . Bilateral salpingo-oophorectomy, lysis of adhesions  1987  . Eye surgery     Prior to Admission medications   Medication Sig Start Date End Date Taking? Authorizing Provider  brimonidine (ALPHAGAN) 0.15 % ophthalmic solution Place 1 drop into both eyes 2 (two) times daily.   Yes  Historical Provider, MD  Calcium-Magnesium-Vitamin D (CALCIUM MAGNESIUM PO) Take 1 tablet by mouth daily.    Yes Historical Provider, MD  cholecalciferol (VITAMIN D) 1000 UNITS tablet Take 1,000 Units by mouth daily.   Yes Historical Provider, MD  diltiazem (DILACOR XR) 180 MG 24 hr capsule Take 180 mg by mouth daily.   Yes Historical Provider, MD  dorzolamide-timolol (COSOPT) 22.3-6.8 MG/ML ophthalmic solution 1 drop 2 (two) times daily.     Yes Historical Provider, MD  glucosamine-chondroitin 500-400 MG tablet Take 1 tablet by mouth 2 (two) times daily.   Yes Historical Provider, MD  latanoprost (XALATAN) 0.005 % ophthalmic solution 1 drop at bedtime.     Yes Historical Provider, MD  Multiple Vitamin (MULTIVITAMIN WITH MINERALS) TABS tablet Take 1 tablet by mouth 2 (two) times daily.   Yes Historical Provider, MD  warfarin (COUMADIN) 5 MG tablet Take 2.5-5 mg by mouth daily. Takes 2.5mg  daily EXCEPT on Sundays Wednesdays and Fridays she takes 5mg    Yes Historical Provider, MD   No Known Allergies  FAMILY HISTORY:  Family History  Problem Relation Age of Onset  . Congestive Heart Failure Mother   . Stroke Mother   . Congestive Heart Failure Father    SOCIAL HISTORY:  reports that she has never smoked. She has never used smokeless tobacco. She reports that she does not drink alcohol or use illicit drugs.  REVIEW OF SYSTEMS:   Constitutional: Negative for fever,  chills, weight loss, malaise/fatigue and diaphoresis.  HENT: Negative for hearing loss, ear pain, nosebleeds, congestion, sore throat, neck pain, tinnitus and ear discharge.   Eyes: Negative for blurred vision, double vision, photophobia, pain, discharge and redness.  Respiratory: C/o  cough,  sputum production, shortness of breath, NO wheezing and stridor.   Cardiovascular: Negative for chest pain, palpitations, orthopnea, claudication, leg swelling and PND.  Gastrointestinal: Negative for heartburn, nausea, vomiting, abdominal  pain, diarrhea, constipation, blood in stool and melena.  Genitourinary: Negative for dysuria, urgency, frequency, hematuria and flank pain.  Musculoskeletal: Negative for myalgias, back pain, joint pain and falls.  Skin: Negative for itching and rash.  Neurological: Negative for dizziness, tingling, tremors, sensory change, speech change, focal weakness, seizures, loss of consciousness, weakness and headaches.  Endo/Heme/Allergies: Negative for environmental allergies and polydipsia. Does not bruise/bleed easily.  SUBJECTIVE: denies CP, breathing better  VITAL SIGNS: Temp:  [97.5 F (36.4 C)-103.9 F (39.9 C)] 97.5 F (36.4 C) (08/15 0730) Pulse Rate:  [91-108] 92 (08/15 0000) Resp:  [22-34] 32 (08/15 0800) BP: (94-128)/(54-96) 123/59 mmHg (08/15 0800) SpO2:  [91 %-97 %] 97 % (08/15 0903) FiO2 (%):  [90 %-100 %] 100 % (08/15 0903) Weight:  [44.2 kg (97 lb 7.1 oz)] 44.2 kg (97 lb 7.1 oz) (08/15 0000)  PHYSICAL EXAMINATION: Gen. Pleasant, well-nourished, in mild distress on venti mask, normal affect Speaking in full sentences ENT - no lesions, no post nasal drip Neck: No JVD, no thyromegaly, no carotid bruits Lungs: +accessory muscles,  Lt 1/2 rales , no rhonchi  Cardiovascular: Rhythm regular, heart sounds  normal, no murmurs, no peripheral edema Abdomen: soft and non-tender, no hepatosplenomegaly, BS normal. Musculoskeletal: No deformities, no cyanosis or clubbing Neuro:  alert, non focal Skin:  Warm, no lesions/ rash    Recent Labs Lab 12/25/2012 1900 12/15/12 0335  NA 132* 134*  K 5.2* 4.3  CL 98 102  CO2 24 24  BUN 29* 26*  CREATININE 0.64 0.59  GLUCOSE 190* 138*    Recent Labs Lab 12/29/2012 2000  HGB 12.7  HCT 37.9  WBC 14.2*  PLT 201   Dg Chest Port 1 View  12/15/2012   *RADIOLOGY REPORT*  Clinical Data: Aspiration pneumonia.  Hypoxia.  Weakness.  PORTABLE CHEST - 1 VIEW  Comparison: 12/23/2012  Findings: Increased airspace opacity in the right upper lobe.  Slightly improved aeration in the left mid lung.  There is consolidation of most the left lung.  Cannot exclude left pleural effusion as well.  Densely atherosclerotic thoracic aorta.  Degenerative glenohumeral arthropathy, left greater than right.  Left heart border is obscured but I would doubt that there is cardiomegaly.  Skin fold projects over the right hemithorax.  IMPRESSION:  1.  Worsened right upper lobe airspace opacity with slightly improved aeration in the left mid lung.  Extensive consolidation in the left lung persist.  Cannot exclude a concurrent left pleural effusion.  Appearance compatible with severe aspiration pneumonia. 2.  Atherosclerosis.   Original Report Authenticated By: Gaylyn Rong, M.D.   Dg Chest Portable 1 View  12/03/2012   *RADIOLOGY REPORT*  Clinical Data: Shortness of breath and cough.  PORTABLE CHEST - 1 VIEW  Comparison: 11/22/2005  Findings: Moderate osteopenia.  Left greater than right glenohumeral joint osteoarthritis.  Moderate cardiomegaly with atherosclerosis in the transverse aorta.  Probable left-sided pleural effusion. No pneumothorax.  Dense opacification of nearly the entire left lung with relative sparing of the left apex. Patchy right-sided airspace disease.  IMPRESSION: Patchy right-sided airspace disease with near complete opacification of the left hemithorax.  Favored to represent multifocal infection/aspiration.  Asymmetric pulmonary edema felt less likely.  Underlying neoplasm cannot be excluded in the left lung.  Radiographic follow-up until clearing is recommended.  Possible left-sided pleural effusion.   Original Report Authenticated By: Jeronimo Greaves, M.D.    ASSESSMENT / PLAN:  Hypoxic respiratory failure Health care associated pneumonia - no risk factors for Pseudomonas No shift of mediastinum on chest x-ray , hence doubt atelectasis , but left pleural effusion possible  Recommend -  Would continue antibiotics, no need for double coverage for  Pseudomonas since no risk factors Dial  down oxygen as saturations permit Would follow serial chest x-rays, and if worse white count or fever, consider left-sided thoracentesis under ultrasound guidance Defer CT chest for now since she is on high flow oxygen. Bronchoscopy is not indicated at this time and certainly would be high risk given high flow oxygen. Discussed with family at bedside, who is also in agreement with the general idea of avoiding invasive procedures. DNR noted  PCCM will follow along  [facility]  Pulmonary and Critical Care Medicine [facility] Pager: [phone]  12/15/2012, 12:41 PM

## 2012-12-15 NOTE — Progress Notes (Signed)
ANTICOAGULATION CONSULT NOTE - Initial Consult  Pharmacy Consult for warfarin Indication: afib  No Known Allergies  Patient Measurements: Height: 5\' 1"  (154.9 cm) Weight: 97 lb 7.1 oz (44.2 kg) IBW/kg (Calculated) : 47.8 Heparin Dosing Weight:  Vital Signs: Temp: 97.5 F (36.4 C) (08/15 0730) Temp src: Oral (08/15 0730) BP: 123/59 mmHg (08/15 0800) Pulse Rate: 92 (08/15 0000)  Labs:  Recent Labs  January 01, 2013 January 01, 2013 1900 Jan 01, 2013 2000 12/15/12 0335  HGB  --   --  12.7  --   HCT  --   --  37.9  --   PLT  --   --  201  --   LABPROT  --  28.0*  --   --   INR 3.2* 2.73*  --   --   CREATININE  --  0.64  --  0.59    Estimated Creatinine Clearance: 31.3 ml/min (by C-G formula based on Cr of 0.59).   Medical History: Past Medical History  Diagnosis Date  . Chronic atrial fibrillation   . HTN (hypertension)   . Pneumonia   . CAD (coronary artery disease)     nonobstructive by cath in 2002  . Glaucoma     Assessment: 75 YOF with h/o afib on chronic warfarin therapy. In afib/RVR in ED - started dilt gtt  Per coumadin clinic note 8/14, home warfarin dose is 2.5mg  daily except Su/W/F  INR at coumadin clinic 8/14 was 3.2, in ED INR = 2.73  CBC WNL  Goal of Therapy:  INR 2-3 Monitor platelets by anticoagulation protocol: Yes   Plan:   Coumadin 2.5mg  tonight, use lower dose than normal home dose due to INR being slightly elevated (although, repeat was WNL in ED) and also concerned about increased warfarin effects d/t antibiotics and acute illness.   Daily INR  Dannielle Huh 12/15/2012,11:50 AM

## 2012-12-15 NOTE — Progress Notes (Signed)
ANTIBIOTIC CONSULT NOTE - INITIAL  Pharmacy Consult for Vancomycin and Zosyn  Indication: rule out pneumonia  No Known Allergies  Patient Measurements: Height: 5\' 1"  (154.9 cm) Weight: 97 lb 7.1 oz (44.2 kg) IBW/kg (Calculated) : 47.8 Adjusted Body Weight:   Vital Signs: Temp: 100.1 F (37.8 C) (08/15 0000) Temp src: Rectal (08/15 0000) BP: 118/96 mmHg (08/15 0000) Pulse Rate: 92 (08/15 0000) Intake/Output from previous day:   Intake/Output from this shift:    Labs:  Recent Labs  12/29/2012 1900 12/17/2012 2000  WBC  --  14.2*  HGB  --  12.7  PLT  --  201  CREATININE 0.64  --    Estimated Creatinine Clearance: 31.3 ml/min (by C-G formula based on Cr of 0.64). No results found for this basename: VANCOTROUGH, VANCOPEAK, VANCORANDOM, GENTTROUGH, GENTPEAK, GENTRANDOM, TOBRATROUGH, TOBRAPEAK, TOBRARND, AMIKACINPEAK, AMIKACINTROU, AMIKACIN,  in the last 72 hours   Microbiology: No results found for this or any previous visit (from the past 720 hour(s)).  Medical History: Past Medical History  Diagnosis Date  . Chronic atrial fibrillation   . HTN (hypertension)   . Pneumonia   . CAD (coronary artery disease)     nonobstructive by cath in 2002  . Glaucoma     Medications:  Anti-infectives   Start     Dose/Rate Route Frequency Ordered Stop   12/15/12 2200  vancomycin (VANCOCIN) IVPB 750 mg/150 ml premix     750 mg 150 mL/hr over 60 Minutes Intravenous Daily at bedtime 12/15/12 0051     12/15/12 0600  piperacillin-tazobactam (ZOSYN) IVPB 3.375 g     3.375 g 12.5 mL/hr over 240 Minutes Intravenous 3 times per day 12/15/12 0051     12/20/2012 2100  vancomycin (VANCOCIN) injection 1 g  Status:  Discontinued     1 g Intravenous  Once 12/28/2012 2003 12/15/2012 2017   12/31/2012 2100  piperacillin-tazobactam (ZOSYN) 3.375 g in dextrose 5 % 50 mL IVPB     3.375 g 100 mL/hr over 30 Minutes Intravenous  Once 12/01/2012 2003 12/29/2012 2116   12/19/2012 2100  vancomycin (VANCOCIN) IVPB  1000 mg/200 mL premix     1,000 mg 200 mL/hr over 60 Minutes Intravenous  Once 12/22/2012 2017 12/09/2012 2247     Assessment: Patient with r/o PNA.  First dose of antibiotics already given in ED.  Patient with poor renal fucntion.  Goal of Therapy:  Vancomycin trough level 15-20 mcg/ml Zosyn based on renal function   Plan:  Measure antibiotic drug levels at steady state Follow up culture results Vancomycin 750mg  iv q24hr Zosyn 3.375g IV Q8H infused over 4hrs.   Darlina Guys, Jacquenette Shone Crowford 12/15/2012,12:51 AM

## 2012-12-15 NOTE — Evaluation (Signed)
Clinical/Bedside Swallow Evaluation Patient Details  Name: Michelle Mccullough MRN: [id number] Date of Birth: Jun 09, 1918  Today's Date: 12/15/2012 Time: 1445-1530 SLP Time Calculation (min): 45 min  Past Medical History:  Past Medical History  Diagnosis Date  . Chronic atrial fibrillation   . HTN (hypertension)   . Pneumonia   . CAD (coronary artery disease)     nonobstructive by cath in 2002  . Glaucoma    Past Surgical History:  Past Surgical History  Procedure Laterality Date  . Cataract extraction    . Colon surgery      x2 for diverticular disease and adhesions  . Abdominal hysterectomy    . Hemorrhoidectomy    . Dilation and curettage of uterus    . Bilateral salpingo-oophorectomy, lysis of adhesions  1987  . Eye surgery     HPI:  79 yo female adm to [facility] with SOB, cough, fever- found to have bilateral pna.  Per CXR pt found to have extensive consolidation of left lung consistent with severe aspiration pna, RUL airspace dx worse, slightly improved LML, neoplasm note excluded.  Pt with worsening confusion x1 month per chart review.  Pt has h/o HTN, CAD, glaucoma, hearing loss.  Swallow evaluation was ordered, pt denied dysphagia or coughing with intake prior to admission.     Assessment / Plan / Recommendation Clinical Impression  Pt's largest aspiration risk currently is her RR that increases to low 30's with intake and oxygen saturation declining to mid 80's on 6 liters oxygen via nasal cannula.  Pt's swallow was timely and there were no immediate indications of aspiration, she also does not have any neurological hx that would indicate oropharyngeal deficits.  However, as po continued, pt observed to have increased WOB and was overtly fatigued after completion of minimal snack.  Weak nonproductive cough x1 when consuming juice after cracker noted - but pt with baseline cough due to pna.  At completion of small snack, approx 1 cracker, 1 ounce applesauce, 3 ounces water - pt  sounded congested at upper airway - concerning for secretions possibly mixed with residuals spilling into airway.  SLP can not rule out silent aspiration at bedside, but do not recommend MBS currently due to work of breathing.    Would advise to keep her NPO except single ice chips.  Concerns for silent nature of dysphagia present due to CXR findings c/w severe aspiration pna.  Also pt's advanced age, deconditioned state, and current respiratory status raise concern for pt to tolerate aspiration.    Pt may benefit from conducting MBS when her respiratory status allows to assure she is not silently aspirating.    Skilled intervention included education family/pt to clinical indications of aspiration, clinical reasoning to continue NPO due to CXR findings, discoordination of swallow/respirations given RR and decreased clearance/tolerance if pt does aspirate due to weakness/fatigue.     SLP will have therapist call tomorrow (from [facility]) to determine if pt may be appropriate for instrumental testing and/or po intake trials.      Aspiration Risk  Severe    Diet Recommendation Ice chips PRN after oral care;NPO except meds   Medication Administration: Whole or crushed po meds with puree    Other  Recommendations Recommended Consults: MBS (when pt respiratory status allows)   Follow Up Recommendations    TBD   Frequency and Duration min 1 x/week  1 week   Pertinent Vitals/Pain Temp up to 103.9, RR up to 30's congested - rales at bases, nonproductive  cough    SLP Swallow Goals Goal #3: Pt will consume 2 ounces applesauce and 2 ounces water with RR remaining below 26 to aid in determining readiness for MBS and or po intake.     Swallow Study Prior Functional Status   pt denies h/o dysphagia or reflux    General Date of Onset: 12/15/12 HPI: 79 yo female adm to [facility] with SOB, cough, fever- found to have bilateral pna.  Per CXR pt found to have extensive consolidation of left lung consistent  with severe aspiration pna, RUL airspace dx worse, slightly improved LML, neoplasm note excluded.  Pt with worsening confusion x1 month per chart review.  Pt has h/o HTN, CAD, glaucoma, hearing loss.  Swallow evaluation was ordered, pt denied dysphagia or coughing with intake prior to admission.   Type of Study: Bedside swallow evaluation Diet Prior to this Study: NPO Temperature Spikes Noted: Yes Respiratory Status: Supplemental O2 delivered via (comment) (venti mask, put on nasal cannula during evaluation) History of Recent Intubation: No Behavior/Cognition: Alert;Cooperative;Pleasant mood Oral Cavity - Dentition: Adequate natural dentition (partials upper and lower) Self-Feeding Abilities: Able to feed self Patient Positioning: Upright in bed Baseline Vocal Quality: Low vocal intensity;Clear Volitional Cough: Weak Volitional Swallow: Able to elicit    Oral/Motor/Sensory Function Overall Oral Motor/Sensory Function: Appears within functional limits for tasks assessed   Ice Chips Ice chips: Within functional limits   Thin Liquid Thin Liquid: Impaired Presentation: Self Fed;Cup;Straw;Spoon Pharyngeal  Phase Impairments: Cough - Immediate Other Comments: immediate cough x1 after pt consumed cracker and then swallowed liquids    Nectar Thick Nectar Thick Liquid: Not tested   Honey Thick Honey Thick Liquid: Not tested   Puree Puree: Within functional limits Presentation: Self Fed;Spoon   Solid   GO    Solid: Impaired Presentation: Self Fed Oral Phase Impairments: Impaired anterior to posterior transit;Reduced lingual movement/coordination Oral Phase Functional Implications: Oral residue (only trace)       Donavan Burnet, MS [facility] SLP [phone]

## 2012-12-15 NOTE — Progress Notes (Signed)
Spoke to Dr. Jomarie Longs re: 2D echo needed for core measure. Dr. Jomarie Longs does not want 2D echo at this time.

## 2012-12-16 ENCOUNTER — Inpatient Hospital Stay (HOSPITAL_COMMUNITY): Payer: Medicare Other

## 2012-12-16 LAB — URINE CULTURE
Colony Count: NO GROWTH
Culture: NO GROWTH

## 2012-12-16 LAB — CBC
HCT: 38.9 % (ref 36.0–46.0)
Hemoglobin: 12.9 g/dL (ref 12.0–15.0)
MCH: 32.3 pg (ref 26.0–34.0)
MCV: 97.3 fL (ref 78.0–100.0)
RBC: 4 MIL/uL (ref 3.87–5.11)

## 2012-12-16 LAB — PROTIME-INR: INR: 4.68 — ABNORMAL HIGH (ref 0.00–1.49)

## 2012-12-16 LAB — BASIC METABOLIC PANEL
CO2: 24 mEq/L (ref 19–32)
Calcium: 8.8 mg/dL (ref 8.4–10.5)
Glucose, Bld: 169 mg/dL — ABNORMAL HIGH (ref 70–99)
Sodium: 137 mEq/L (ref 135–145)

## 2012-12-16 LAB — LEGIONELLA ANTIGEN, URINE

## 2012-12-16 MED ORDER — LORAZEPAM 2 MG/ML IJ SOLN: 0.5000 mg | Freq: Four times a day (QID) | INTRAMUSCULAR | Status: DC | PRN

## 2012-12-16 MED ORDER — LORAZEPAM 2 MG/ML IJ SOLN: 1.0000 mg | Freq: Four times a day (QID) | INTRAMUSCULAR | Status: DC | PRN

## 2012-12-16 MED ORDER — LORAZEPAM 2 MG/ML IJ SOLN
INTRAMUSCULAR | Status: AC
  Administered 2012-12-16: 0.5 mg via INTRAVENOUS
  Filled 2012-12-16: qty 1

## 2012-12-16 MED ORDER — FUROSEMIDE 10 MG/ML IJ SOLN
INTRAMUSCULAR | Status: AC
  Administered 2012-12-16: 20 mg via INTRAVENOUS
  Filled 2012-12-16: qty 4

## 2012-12-16 MED ORDER — SODIUM CHLORIDE 0.9 % IV SOLN
1.0000 mg/h | INTRAVENOUS | Status: DC
  Administered 2012-12-16: 1 mg/h via INTRAVENOUS
  Filled 2012-12-16: qty 10

## 2012-12-16 MED ORDER — FUROSEMIDE 10 MG/ML IJ SOLN: 20.0000 mg | Freq: Once | INTRAMUSCULAR | Status: DC

## 2012-12-20 LAB — CULTURE, BLOOD (ROUTINE X 2)

## 2012-12-22 ENCOUNTER — Ambulatory Visit: Payer: Self-pay | Admitting: Internal Medicine

## 2012-12-22 DIAGNOSIS — I482 Chronic atrial fibrillation, unspecified: Secondary | ICD-10-CM

## 2013-01-01 NOTE — Progress Notes (Signed)
Patient expired at 19:10, morphine drip wasted, 67.5 ml witnessed by Vikki Ports RN

## 2013-01-01 NOTE — Progress Notes (Signed)
Wasted 67.5 ml Morphine drip with Francie Massing.

## 2013-01-01 NOTE — Progress Notes (Signed)
Paged at 10:47am.  Nurse stated family requested Chaplain.  Spent lengthy amount of time with eldest brother, retired Engineer, site.  Spent time with the grandchildren (in their 30's, two girls), and their children.  Younger brother arrived later with his wife.  Sought to somewhat prepare the family for the emotions and call them to the closeness of the event of death and then to their faith beliefs as well.  Family will go through this well and together.  They seemed close and supportive.  Patient's husband is still alive with Alzheimer's in a memory facility.  Rema Jasmine, Chaplain Pager: [phone]

## 2013-01-01 NOTE — Progress Notes (Signed)
Transferred in from ICU, non responsive, on morphine drip , family at bedside, o2 applied at 6l/[place].Patient made comfortable ,

## 2013-01-01 NOTE — Progress Notes (Signed)
[facility] PROGRESS NOTE  Michelle Mccullough WJX:[id number] DOB: February 22, 1919 DOA: 12-30-12 PCP: Hoyle Sauer, MD  Assessment/Plan: 1. Acute hypoxic resp failure -clinically worsening with regards to hypoxia, agitation, confusion, this am sats of 70% on NRM -extensive L lung infiltrate, white out and increasing R lung involvement of R lung -No improvement despite VAnc.Zosyn/nebs/chest PT -repeat CXR with worsening -appreciate PCCM assistance, -I d/w Both sons today, and reiterated options of intubation or comfort based care and they wanted to focus on comfort only. -Will stop all medications -start morphine gtt and move to Palliative Unit  2. Dementia-mild  -very functional at baseline  3. Afib: rate better controlled -on cardizem currently -coumadin  4. CAD -stable  5. DVt proph: lovenox  Code Status: DNR Family Communication:none at bedside, will call son later today  Disposition Plan: Tx to palliative unit  Antibiotics:  VAnc/Zosyn  HPI/Subjective: Agitated , confused, hypoxic,  on NRM  Objective: Filed Vitals:   12/03/2012 0745  BP:   Pulse:   Temp: 97 F (36.1 C)  Resp:     Intake/Output Summary (Last 24 hours) at 12/12/2012 0856 Last data filed at 12/19/2012 0700  Gross per 24 hour  Intake   2065 ml  Output   1400 ml  Net    665 ml   Filed Weights   12/15/12 0000  Weight: 44.2 kg (97 lb 7.1 oz)    Exam:   General: obtunded, minimally responsive  Cardiovascular: S1S2/RRR  Respiratory: crcakles throughout both lungs  Abdomen:soft, Nt, BS present  Musculoskeletal: no edema c/c  Data Reviewed: Basic Metabolic Panel:  Recent Labs Lab 12/30/12 1900 12/15/12 0335 12/03/2012 0400  NA 132* 134* 137  K 5.2* 4.3 3.6  CL 98 102 103  CO2 24 24 24   GLUCOSE 190* 138* 169*  BUN 29* 26* 26*  CREATININE 0.64 0.59 0.64  CALCIUM 9.1 8.7 8.8   Liver Function Tests:  Recent Labs Lab December 30, 2012 1900  AST 47*  ALT 24  ALKPHOS 61   BILITOT 1.1  PROT 7.1  ALBUMIN 3.0*   No results found for this basename: LIPASE, AMYLASE,  in the last 168 hours No results found for this basename: AMMONIA,  in the last 168 hours CBC:  Recent Labs Lab 2012/12/30 2000 12/17/2012 0400  WBC 14.2* 30.4*  NEUTROABS 12.1*  --   HGB 12.7 12.9  HCT 37.9 38.9  MCV 95.2 97.3  PLT 201 210   Cardiac Enzymes: No results found for this basename: CKTOTAL, CKMB, CKMBINDEX, TROPONINI,  in the last 168 hours BNP (last 3 results) No results found for this basename: PROBNP,  in the last 8760 hours CBG:  Recent Labs Lab 12/15/12 0733 12/15/12 1245 12/15/12 1646  GLUCAP 126* 114* 148*    Recent Results (from the past 240 hour(s))  CULTURE, BLOOD (ROUTINE X 2)     Status: None   Collection Time    30-Dec-2012  7:00 PM      Result Value Range Status   Specimen Description BLOOD LEFT ARM   Final   Special Requests BOTTLES DRAWN AEROBIC AND ANAEROBIC 4CC EACH   Final   Culture  Setup Time     Final   Value: December 30, 2012 23:23     Performed at [facility]   Culture     Final   Value: GRAM NEGATIVE RODS     Note: Gram Stain Report Called to,Read Back By and Verified With:  BROOKE WHITE     Performed at  [facility]   Report Status PENDING   Incomplete  CULTURE, BLOOD (ROUTINE X 2)     Status: None   Collection Time    January 06, 2013  7:27 PM      Result Value Range Status   Specimen Description BLOOD RIGHT ARM   Final   Special Requests BOTTLES DRAWN AEROBIC AND ANAEROBIC [facility] EACH   Final   Culture  Setup Time     Final   Value: 01-06-2013 23:24     Performed at [facility]   Culture     Final   Value: GRAM NEGATIVE RODS     Note: Gram Stain Report Called to,Read Back By and Verified With: BROOKE WHITE CHARGE NURSE ON 12/31/2012 AT 1:33A BY [employer]     Performed at [facility]   Report Status PENDING   Incomplete  MRSA PCR SCREENING     Status: None   Collection Time    12/15/12 12:15 AM      Result Value  Range Status   MRSA by PCR NEGATIVE  NEGATIVE Final   Comment:            The GeneXpert MRSA Assay (FDA     approved for NASAL specimens     only), is one component of a     comprehensive MRSA colonization     surveillance program. It is not     intended to diagnose MRSA     infection nor to guide or     monitor treatment for     MRSA infections.     Studies: Dg Chest Port 1 View  12/31/2012   *RADIOLOGY REPORT*  Clinical Data: Follow up pneumonia.  PORTABLE CHEST - 1 VIEW  Comparison: 12/15/2012  Findings: There is continued worsening of lung aeration.  More consolidation has extended throughout much of the right upper lobe. The left mid to lower lung zone is now nearly completely consolidated.  Mild hazy airspace opacity the right lower lung zone has mildly increased as well.  No pneumothorax.  IMPRESSION: Worsening lung aeration compared to the prior study.  This suggests worsening multifocal pneumonia.   Original Report Authenticated By: Amie Portland, M.D.   Dg Chest Port 1 View  12/15/2012   *RADIOLOGY REPORT*  Clinical Data: Aspiration pneumonia.  Hypoxia.  Weakness.  PORTABLE CHEST - 1 VIEW  Comparison: 01-06-2013  Findings: Increased airspace opacity in the right upper lobe. Slightly improved aeration in the left mid lung.  There is consolidation of most the left lung.  Cannot exclude left pleural effusion as well.  Densely atherosclerotic thoracic aorta.  Degenerative glenohumeral arthropathy, left greater than right.  Left heart border is obscured but I would doubt that there is cardiomegaly.  Skin fold projects over the right hemithorax.  IMPRESSION:  1.  Worsened right upper lobe airspace opacity with slightly improved aeration in the left mid lung.  Extensive consolidation in the left lung persist.  Cannot exclude a concurrent left pleural effusion.  Appearance compatible with severe aspiration pneumonia. 2.  Atherosclerosis.   Original Report Authenticated By: Gaylyn Rong, M.D.    Dg Chest Portable 1 View  01/06/2013   *RADIOLOGY REPORT*  Clinical Data: Shortness of breath and cough.  PORTABLE CHEST - 1 VIEW  Comparison: 11/22/2005  Findings: Moderate osteopenia.  Left greater than right glenohumeral joint osteoarthritis.  Moderate cardiomegaly with atherosclerosis in the transverse aorta.  Probable left-sided pleural effusion. No pneumothorax.  Dense opacification of nearly the entire left  lung with relative sparing of the left apex. Patchy right-sided airspace disease.  IMPRESSION: Patchy right-sided airspace disease with near complete opacification of the left hemithorax.  Favored to represent multifocal infection/aspiration.  Asymmetric pulmonary edema felt less likely.  Underlying neoplasm cannot be excluded in the left lung.  Radiographic follow-up until clearing is recommended.  Possible left-sided pleural effusion.   Original Report Authenticated By: Jeronimo Greaves, M.D.    Scheduled Meds: . albuterol  2.5 mg Nebulization Q6H  . antiseptic oral rinse  15 mL Mouth Rinse q12n4p  . brimonidine  1 drop Both Eyes BID  . chlorhexidine  15 mL Mouth Rinse BID  . dorzolamide-timolol  1 drop Both Eyes BID  . furosemide      . furosemide  20 mg Intravenous Once  . insulin aspart  0-9 Units Subcutaneous TID WC  . latanoprost  1 drop Both Eyes QHS  . metoprolol  2.5 mg Intravenous Q8H  . piperacillin-tazobactam (ZOSYN)  IV  3.375 g Intravenous Q8H  . sodium chloride  3 mL Intravenous Q12H  . vancomycin  750 mg Intravenous QHS  . Warfarin - Pharmacist Dosing Inpatient   Does not apply q1800   Continuous Infusions: . diltiazem (CARDIZEM) infusion Stopped (12/15/12 2152)    Principal Problem:   Aspiration pneumonia Active Problems:   Chronic atrial fibrillation   HCAP (healthcare-associated pneumonia)   Acute respiratory failure with hypoxia    Time spent:    [facility]  [facility] Pager [phone]. If 7PM-7AM, please contact night-coverage at  [email or web address], password [facility] 12/02/2012, 8:56 AM  LOS: 2 days

## 2013-01-01 NOTE — Progress Notes (Signed)
Vital signs not appreciated, absence of heart beat , pulses and respiration, patient pronounced dead at 19:10, family at bedside.

## 2013-01-01 NOTE — Discharge Summary (Signed)
Death Summary  Michelle Mccullough QIO:[id number] DOB: 09-11-1918 DOA: 2012/12/26  PCP: Hoyle Sauer, MD  Admit date: 12-26-12 Date of Death: 12-28-12  Final Diagnoses:      Acute respiratory failure with hypoxia   Health care associated pnuemonia   Hypoxia   Leukocytosis   Sepsis   Afib   CAD   HTN  History of present illness:  Michelle Mccullough is a 79 y.o. female, with a past medical history significant for active fibrillation on Coumadin, coronary artery disease and hypertension who was brought today from the nursing home for evaluation of shortness of breath, and hypoxemia. Her son yesterday Michelle Mccullough noted that she might have been disturbed breath while talking to her on the phone, but today she was confused and more short of breath and her pulse ox was 84% and the nursing home where she stays. In the emergency room the patient was noted to be in A. fib with rapid ventricular rate and she was noted to have bilateral pneumonia with complete white out of Left Lung.   Hospital Course:  1. Acute hypoxic resp failure -clinically worsened with regards to hypoxia, agitation, confusion, this am sats of 70% on 100%NRM  -family declines mechanical intubation, based on patients prior wishes -extensive L lung infiltrate, white out and increasing R lung involvement of R lung  -No improvement despite VAnc.Zosyn/nebs/chest PT  -repeat CXR with worsening  -Was seen/followed by PCCM in consultation  -I d/w Both sons today, and reiterated options of intubation or comfort based care and they wanted to focus on comfort only.  -Hence stopped all medications, started morphine drip and moved to Palliative Unit, subsequently expired on December 29, 2022    Time:  Signed:  Kaleya Douse  [facility] 12/27/2012, 9:40 PM

## 2013-01-01 DEATH — deceased

## 2013-02-09 LAB — CULTURE, BLOOD (ROUTINE X 2)

## 2013-11-15 NOTE — Telephone Encounter (Signed)
Close encounter 

## 2014-01-22 IMAGING — CR DG CHEST 1V PORT
1 series · 1 of 1 positions shown · non-contrast
Comparison: 12/14/2012

CLINICAL DATA: Aspiration pneumonia.  Hypoxia.  Weakness.

PORTABLE CHEST - 1 VIEW

[AP]
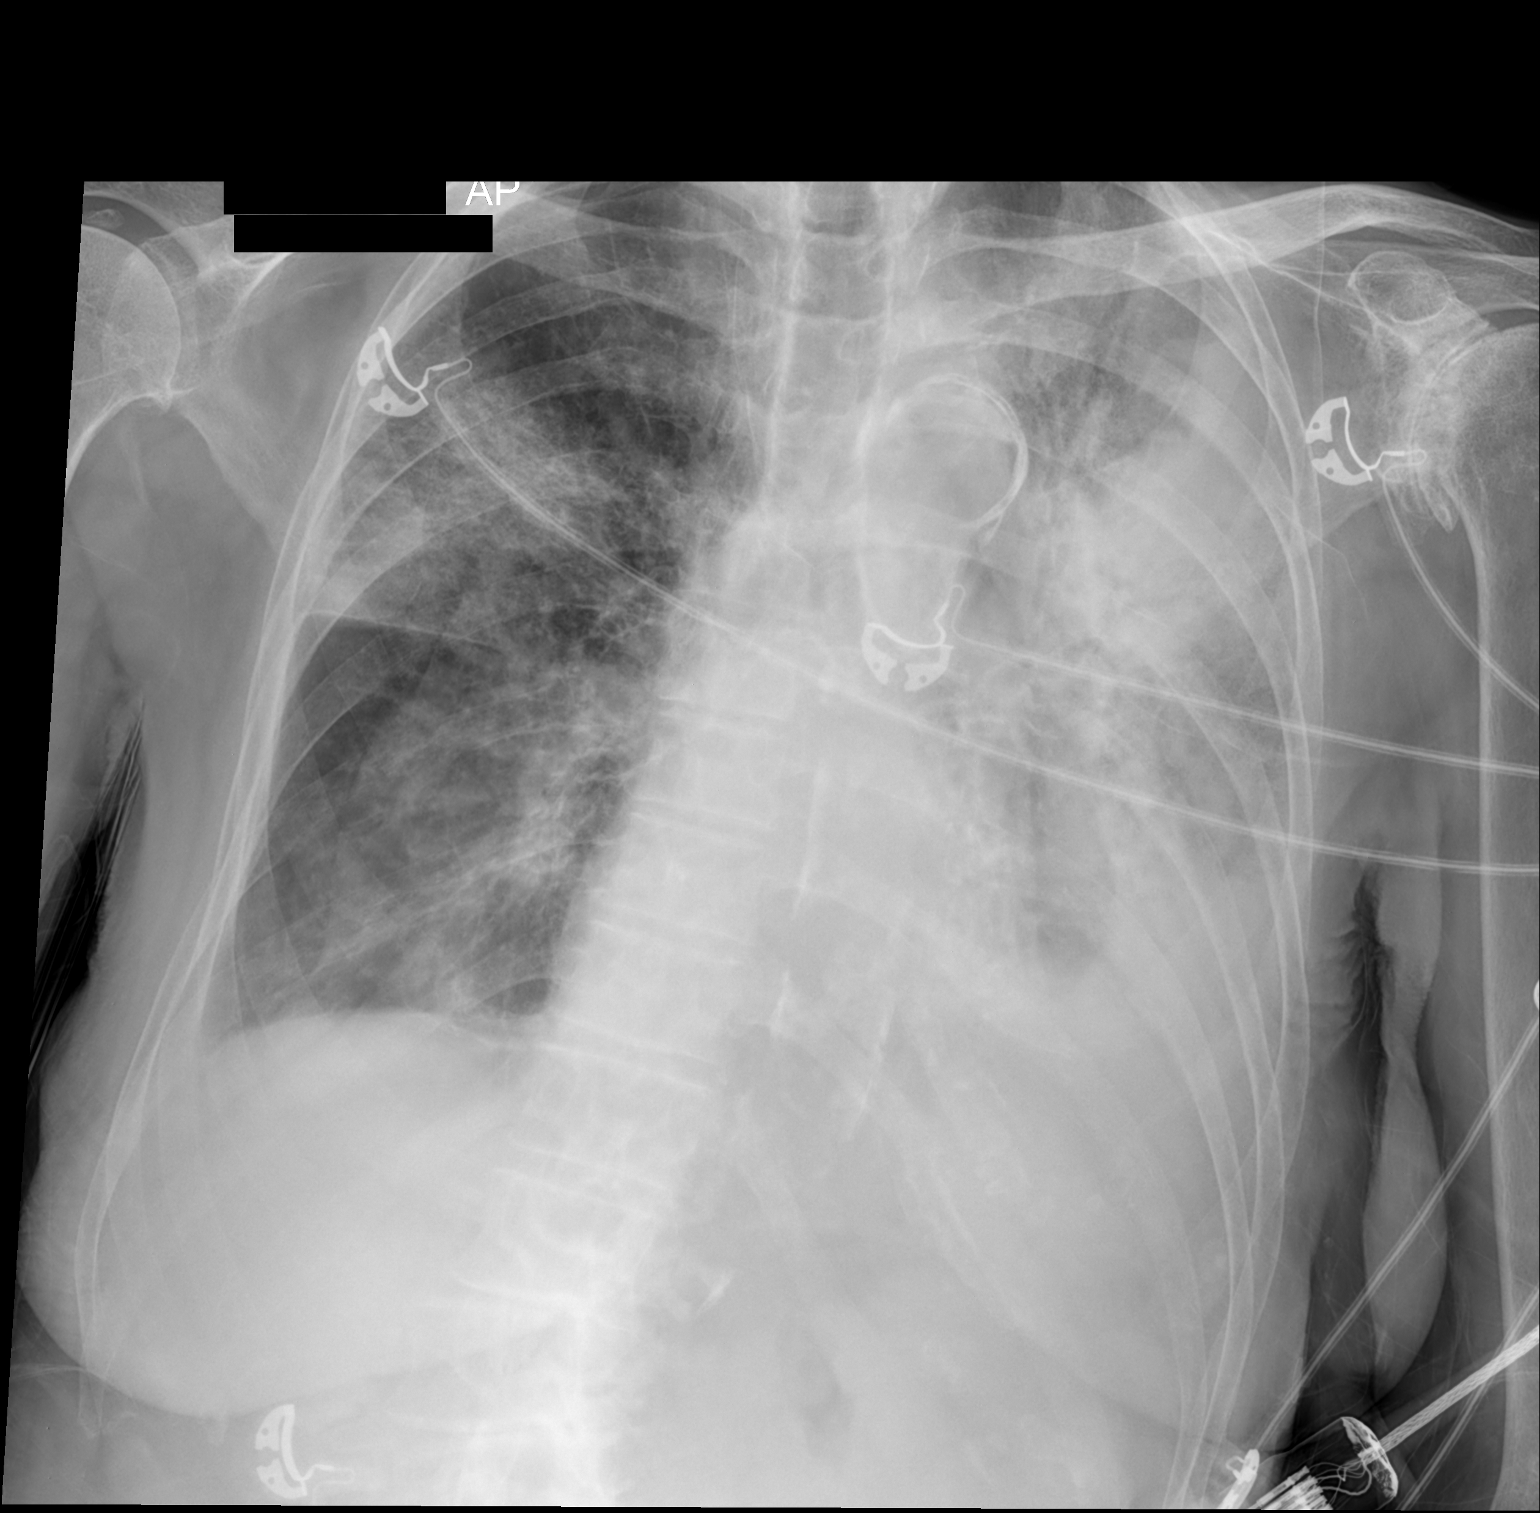

[1 of 1 positions shown; findings below may reference images not displayed]

FINDINGS: Increased airspace opacity in the right upper lobe.
Slightly improved aeration in the left mid lung.  There is
consolidation of most the left lung.  Cannot exclude left pleural
effusion as well.

Densely atherosclerotic thoracic aorta.  Degenerative glenohumeral
arthropathy, left greater than right.  Left heart border is
obscured but I would doubt that there is cardiomegaly.  Skin fold
projects over the right hemithorax.
IMPRESSION: 1.  Worsened right upper lobe airspace opacity with slightly
improved aeration in the left mid lung.  Extensive consolidation in
the left lung persist.  Cannot exclude a concurrent left pleural
effusion.  Appearance compatible with severe aspiration pneumonia.
2.  Atherosclerosis.

## 2014-01-23 IMAGING — CR DG CHEST 1V PORT
1 series · 1 of 1 positions shown · non-contrast
Comparison: 12/15/2012

CLINICAL DATA: Follow up pneumonia.

PORTABLE CHEST - 1 VIEW

[AP]
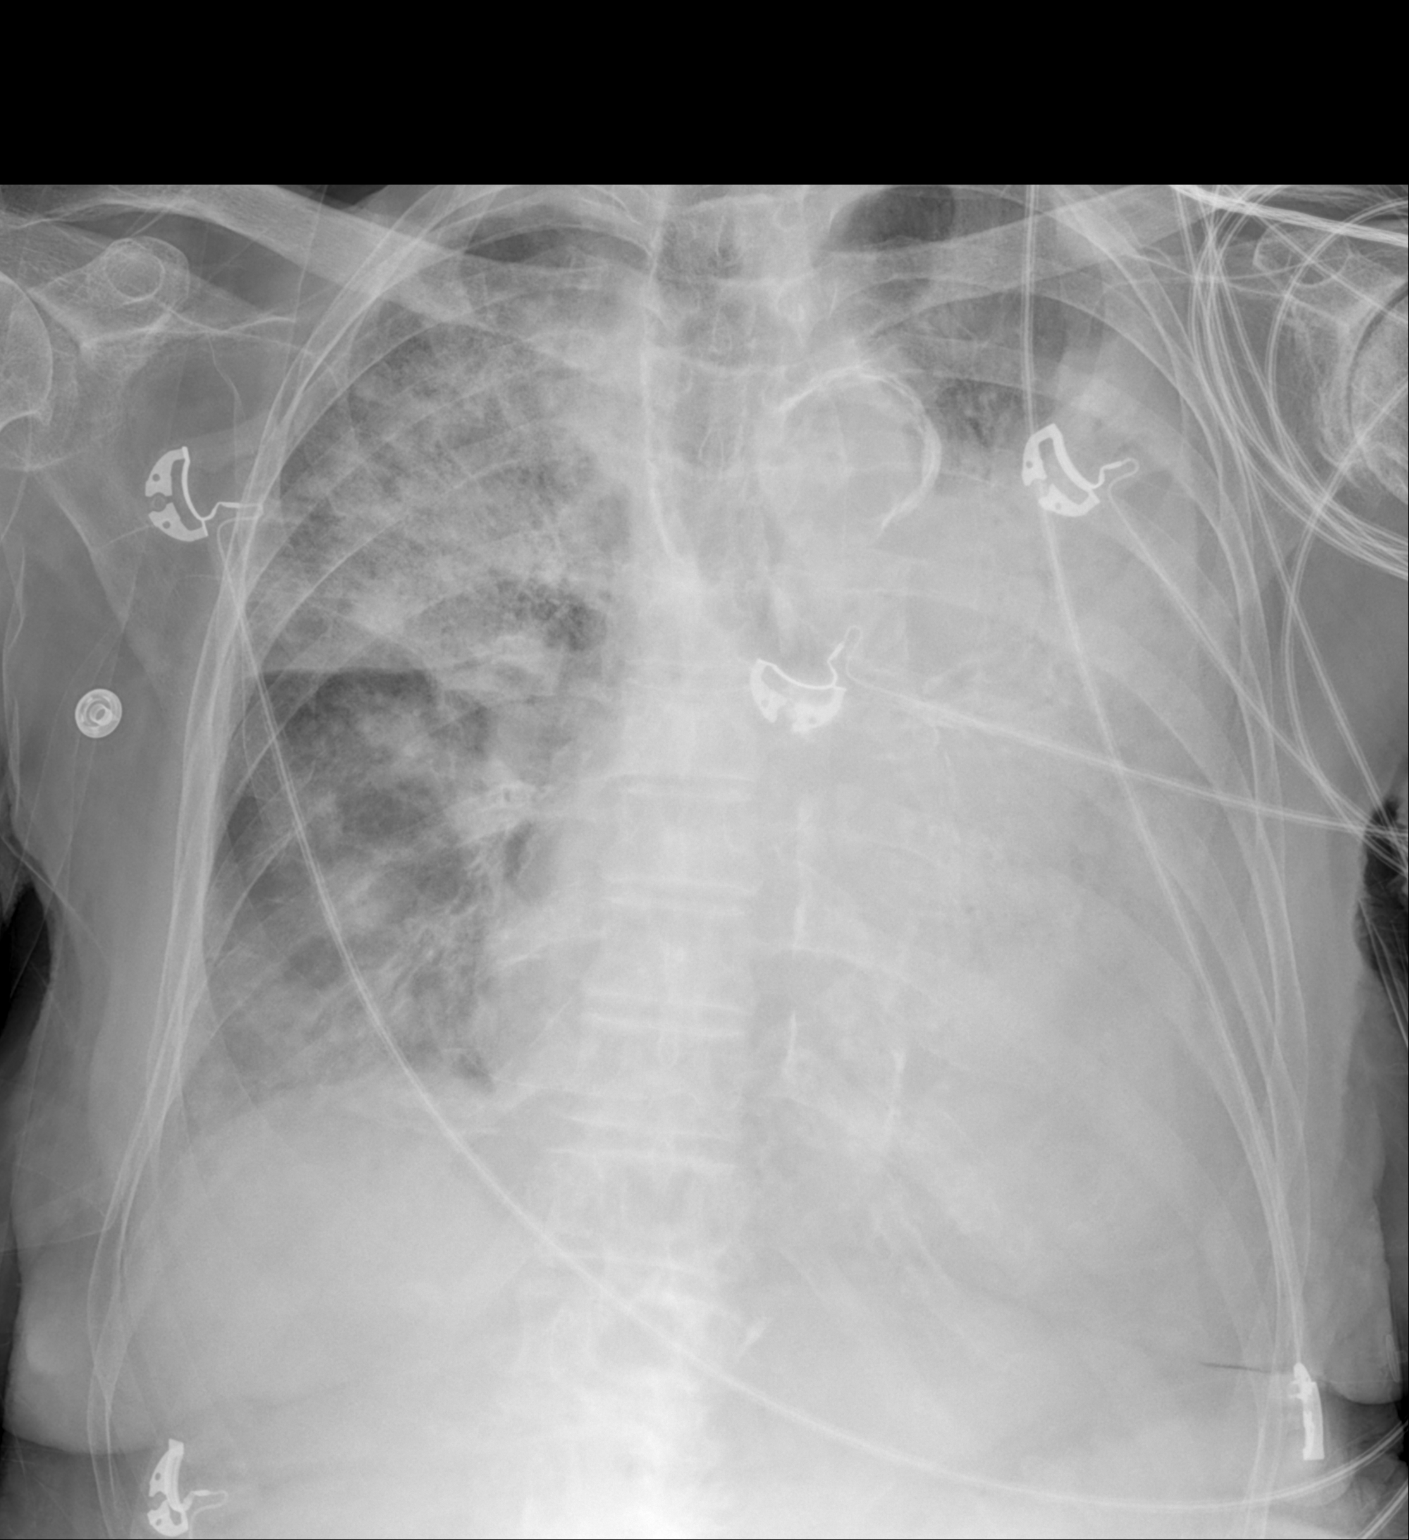

[1 of 1 positions shown; findings below may reference images not displayed]

FINDINGS: There is continued worsening of lung aeration.  More
consolidation has extended throughout much of the right upper lobe.
The left mid to lower lung zone is now nearly completely
consolidated.  Mild hazy airspace opacity the right lower lung zone
has mildly increased as well.

No pneumothorax.
IMPRESSION: Worsening lung aeration compared to the prior study.  This suggests
worsening multifocal pneumonia.
